# Patient Record
Sex: Female | Born: 1970 | Race: Black or African American | Hispanic: No | Marital: Married | Smoking: Never smoker
Health system: Southern US, Community
[De-identification: ages and names within clinical notes are randomized; demographics above are authoritative.]

## PROBLEM LIST (undated history)

## (undated) HISTORY — PX: OTHER SURGICAL HISTORY: SHX169

## (undated) HISTORY — PX: HYSTEROSCOPY: SHX211

## (undated) HISTORY — PX: TONSILLECTOMY: SUR1361

---

## 2002-12-21 ENCOUNTER — Other Ambulatory Visit: Payer: Self-pay

## 2004-08-07 ENCOUNTER — Emergency Department: Payer: Self-pay | Admitting: Unknown Physician Specialty

## 2004-08-08 ENCOUNTER — Emergency Department: Payer: Self-pay | Admitting: Emergency Medicine

## 2007-05-06 ENCOUNTER — Ambulatory Visit: Payer: Self-pay | Admitting: Unknown Physician Specialty

## 2008-05-12 ENCOUNTER — Ambulatory Visit: Payer: Self-pay | Admitting: Unknown Physician Specialty

## 2009-06-07 ENCOUNTER — Ambulatory Visit: Payer: Self-pay | Admitting: Unknown Physician Specialty

## 2010-05-26 ENCOUNTER — Ambulatory Visit: Payer: Self-pay

## 2010-05-31 ENCOUNTER — Ambulatory Visit: Payer: Self-pay

## 2011-04-25 ENCOUNTER — Emergency Department: Payer: Self-pay | Admitting: Emergency Medicine

## 2011-04-25 LAB — URINALYSIS, COMPLETE
Blood: NEGATIVE
Ketone: NEGATIVE
Nitrite: NEGATIVE
Ph: 5 (ref 4.5–8.0)
Protein: NEGATIVE
RBC,UR: 1 /HPF (ref 0–5)
Specific Gravity: 1.02 (ref 1.003–1.030)
Squamous Epithelial: 1
WBC UR: 1 /HPF (ref 0–5)

## 2011-04-25 LAB — WET PREP, GENITAL

## 2011-04-25 LAB — CBC
HGB: 12.8 g/dL (ref 12.0–16.0)
MCH: 30 pg (ref 26.0–34.0)
MCHC: 32.2 g/dL (ref 32.0–36.0)
RBC: 4.27 10*6/uL (ref 3.80–5.20)
RDW: 13.4 % (ref 11.5–14.5)

## 2011-04-25 LAB — COMPREHENSIVE METABOLIC PANEL
Alkaline Phosphatase: 122 U/L (ref 50–136)
BUN: 11 mg/dL (ref 7–18)
Bilirubin,Total: 0.3 mg/dL (ref 0.2–1.0)
Chloride: 108 mmol/L — ABNORMAL HIGH (ref 98–107)
Creatinine: 0.95 mg/dL (ref 0.60–1.30)
EGFR (African American): >60
Glucose: 100 mg/dL — ABNORMAL HIGH (ref 65–99)
Osmolality: 286 (ref 275–301)
SGOT(AST): 31 U/L (ref 15–37)
Total Protein: 7.2 g/dL (ref 6.4–8.2)

## 2011-04-25 LAB — PREGNANCY, URINE: Pregnancy Test, Urine: NEGATIVE m[IU]/mL

## 2013-07-28 ENCOUNTER — Emergency Department: Payer: Self-pay | Admitting: Emergency Medicine

## 2013-10-26 ENCOUNTER — Emergency Department: Payer: Self-pay | Admitting: Emergency Medicine

## 2013-10-27 LAB — URINALYSIS, COMPLETE
Bilirubin,UR: NEGATIVE
Blood: NEGATIVE
GLUCOSE, UR: NEGATIVE mg/dL (ref 0–75)
KETONE: NEGATIVE
Leukocyte Esterase: NEGATIVE
Nitrite: NEGATIVE
PH: 6 (ref 4.5–8.0)
Protein: NEGATIVE
RBC,UR: <1 /HPF (ref 0–5)
SQUAMOUS EPITHELIAL: NONE SEEN
Specific Gravity: 1.002 (ref 1.003–1.030)
WBC UR: <1 /HPF (ref 0–5)

## 2013-10-27 LAB — CBC
HCT: 38.4 % (ref 35.0–47.0)
HGB: 12.2 g/dL (ref 12.0–16.0)
MCH: 30.2 pg (ref 26.0–34.0)
MCHC: 31.9 g/dL — ABNORMAL LOW (ref 32.0–36.0)
MCV: 95 fL (ref 80–100)
PLATELETS: 330 10*3/uL (ref 150–440)
RBC: 4.06 10*6/uL (ref 3.80–5.20)
RDW: 13.4 % (ref 11.5–14.5)
WBC: 10.3 10*3/uL (ref 3.6–11.0)

## 2013-10-27 LAB — GC/CHLAMYDIA PROBE AMP

## 2013-10-27 LAB — WET PREP, GENITAL

## 2013-10-27 LAB — HCG, QUANTITATIVE, PREGNANCY: Beta Hcg, Quant.: 23808 m[IU]/mL — ABNORMAL HIGH

## 2013-11-15 ENCOUNTER — Emergency Department: Payer: Self-pay | Admitting: Internal Medicine

## 2013-11-15 LAB — HCG, QUANTITATIVE, PREGNANCY: BETA HCG, QUANT.: 106894 m[IU]/mL — AB

## 2013-11-15 LAB — CBC
HCT: 37 % (ref 35.0–47.0)
HGB: 12.3 g/dL (ref 12.0–16.0)
MCH: 31.3 pg (ref 26.0–34.0)
MCHC: 33.1 g/dL (ref 32.0–36.0)
MCV: 95 fL (ref 80–100)
Platelet: 315 10*3/uL (ref 150–440)
RBC: 3.92 10*6/uL (ref 3.80–5.20)
RDW: 13.6 % (ref 11.5–14.5)
WBC: 8 10*3/uL (ref 3.6–11.0)

## 2014-01-05 ENCOUNTER — Encounter: Payer: Self-pay | Admitting: Obstetrics and Gynecology

## 2014-01-29 ENCOUNTER — Encounter: Payer: Self-pay | Admitting: Obstetrics & Gynecology

## 2014-02-09 ENCOUNTER — Observation Stay: Payer: Self-pay | Admitting: Emergency Medicine

## 2014-02-09 LAB — URINALYSIS, COMPLETE
Bacteria: NONE SEEN
Bilirubin,UR: NEGATIVE
Blood: NEGATIVE
Glucose,UR: NEGATIVE mg/dL (ref 0–75)
Ketone: NEGATIVE
LEUKOCYTE ESTERASE: NEGATIVE
NITRITE: NEGATIVE
PROTEIN: NEGATIVE
Ph: 5 (ref 4.5–8.0)
Specific Gravity: 1.032 (ref 1.003–1.030)
Squamous Epithelial: <1

## 2014-03-31 ENCOUNTER — Encounter: Payer: Self-pay | Admitting: Pediatric Cardiology

## 2014-04-09 ENCOUNTER — Encounter
Admit: 2014-04-09 | Disposition: A | Discharge status: Home / Self Care | Payer: Self-pay | Attending: Obstetrics and Gynecology | Admitting: Obstetrics and Gynecology

## 2014-04-23 ENCOUNTER — Encounter
Admit: 2014-04-23 | Disposition: A | Discharge status: Home / Self Care | Payer: Self-pay | Attending: Obstetrics & Gynecology | Admitting: Obstetrics & Gynecology

## 2015-03-15 ENCOUNTER — Other Ambulatory Visit: Payer: Self-pay | Admitting: *Deleted

## 2015-03-15 ENCOUNTER — Inpatient Hospital Stay
Admission: RE | Admit: 2015-03-15 | Discharge: 2015-03-15 | Disposition: A | Discharge status: Home / Self Care | Payer: Self-pay | Source: Ambulatory Visit | Attending: *Deleted | Admitting: *Deleted

## 2015-03-15 ENCOUNTER — Other Ambulatory Visit: Payer: Self-pay | Admitting: Obstetrics & Gynecology

## 2015-03-15 DIAGNOSIS — R928 Other abnormal and inconclusive findings on diagnostic imaging of breast: Secondary | ICD-10-CM

## 2015-03-15 DIAGNOSIS — Z9289 Personal history of other medical treatment: Secondary | ICD-10-CM

## 2015-03-26 ENCOUNTER — Other Ambulatory Visit: Payer: Self-pay

## 2015-03-26 ENCOUNTER — Ambulatory Visit: Payer: Self-pay

## 2015-04-08 ENCOUNTER — Encounter
Admission: RE | Admit: 2015-04-08 | Discharge: 2015-04-08 | Disposition: A | Discharge status: Home / Self Care | Payer: Managed Care, Other (non HMO) | Source: Ambulatory Visit | Attending: Obstetrics & Gynecology | Admitting: Obstetrics & Gynecology

## 2015-04-08 DIAGNOSIS — Z01812 Encounter for preprocedural laboratory examination: Secondary | ICD-10-CM | POA: Diagnosis present

## 2015-04-08 DIAGNOSIS — N920 Excessive and frequent menstruation with regular cycle: Secondary | ICD-10-CM | POA: Diagnosis not present

## 2015-04-08 LAB — CBC
HCT: 37.3 % (ref 35.0–47.0)
Hemoglobin: 12.3 g/dL (ref 12.0–16.0)
MCH: 28.7 pg (ref 26.0–34.0)
MCHC: 33.1 g/dL (ref 32.0–36.0)
MCV: 86.8 fL (ref 80.0–100.0)
PLATELETS: 381 10*3/uL (ref 150–440)
RBC: 4.29 MIL/uL (ref 3.80–5.20)
RDW: 14.8 % — AB (ref 11.5–14.5)
WBC: 5.8 10*3/uL (ref 3.6–11.0)

## 2015-04-08 LAB — BASIC METABOLIC PANEL
ANION GAP: 4 — AB (ref 5–15)
BUN: 15 mg/dL (ref 6–20)
CALCIUM: 9.3 mg/dL (ref 8.9–10.3)
CHLORIDE: 108 mmol/L (ref 101–111)
CO2: 25 mmol/L (ref 22–32)
CREATININE: 0.87 mg/dL (ref 0.44–1.00)
GFR calc Af Amer: >60 mL/min (ref >60)
GFR calc non Af Amer: >60 mL/min (ref >60)
GLUCOSE: 101 mg/dL — AB (ref 65–99)
Potassium: 4.1 mmol/L (ref 3.5–5.1)
Sodium: 137 mmol/L (ref 135–145)

## 2015-04-08 LAB — TYPE AND SCREEN
ABO/RH(D): O POS
Antibody Screen: NEGATIVE

## 2015-04-08 LAB — ABO/RH: ABO/RH(D): O POS

## 2015-04-08 NOTE — Patient Instructions (Signed)
  Your procedure is scheduled on:04/13/15 Report to Day Surgery. To find out your arrival time please call [phone] between 1PM - 3PM on 04/12/15.  Remember: Instructions that are not followed completely may result in serious medical risk, up to and including death, or upon the discretion of your surgeon and anesthesiologist your surgery may need to be rescheduled.    _x___ 1. Do not eat food or drink liquids after midnight. No gum chewing or hard candies.     __x__ 2. No Alcohol for 24 hours before or after surgery.   ____ 3. Bring all medications with you on the day of surgery if instructed.    _x___ 4. Notify your doctor if there is any change in your medical condition     (cold, fever, infections).     Do not wear jewelry, make-up, hairpins, clips or nail polish.  Do not wear lotions, powders, or perfumes. You may wear deodorant.  Do not shave 48 hours prior to surgery. Men may shave face and neck.  Do not bring valuables to the hospital.    [facility] is not responsible for any belongings or valuables.               Contacts, dentures or bridgework may not be worn into surgery.  Leave your suitcase in the car. After surgery it may be brought to your room.  For patients admitted to the hospital, discharge time is determined by your                treatment team.   Patients discharged the day of surgery will not be allowed to drive home.   Please read over the following fact sheets that you were given:   Surgical Site Infection Prevention   ____ Take these medicines the morning of surgery with A SIP OF WATER:    1. May take claritin if needed  2.   3.   4.  5.  6.  ____ Fleet Enema (as directed)   __x__ Use CHG Soap as directed  ____ Use inhalers on the day of surgery  ____ Stop metformin 2 days prior to surgery    ____ Take 1/2 of usual insulin dose the night before surgery and none on the morning of surgery.   ____ Stop Coumadin/Plavix/aspirin on   __x__  Stop Anti-inflammatories on  May take tylenol only  ____ Stop supplements until after surgery.    ____ Bring C-Pap to the hospital.

## 2015-04-12 ENCOUNTER — Ambulatory Visit
Admission: RE | Admit: 2015-04-12 | Discharge: 2015-04-12 | Disposition: A | Discharge status: Home / Self Care | Payer: Managed Care, Other (non HMO) | Source: Ambulatory Visit | Attending: Obstetrics & Gynecology | Admitting: Obstetrics & Gynecology

## 2015-04-12 DIAGNOSIS — R928 Other abnormal and inconclusive findings on diagnostic imaging of breast: Secondary | ICD-10-CM | POA: Insufficient documentation

## 2015-04-13 ENCOUNTER — Ambulatory Visit: Payer: Managed Care, Other (non HMO) | Admitting: Anesthesiology

## 2015-04-13 ENCOUNTER — Encounter: Payer: Self-pay | Admitting: *Deleted

## 2015-04-13 ENCOUNTER — Ambulatory Visit
Admission: RE | Admit: 2015-04-13 | Discharge: 2015-04-13 | Disposition: A | Discharge status: Home / Self Care | Payer: Managed Care, Other (non HMO) | Source: Ambulatory Visit | Attending: Obstetrics & Gynecology | Admitting: Obstetrics & Gynecology

## 2015-04-13 ENCOUNTER — Encounter
Admission: RE | Disposition: A | Discharge status: Home / Self Care | Payer: Self-pay | Source: Ambulatory Visit | Attending: Obstetrics & Gynecology

## 2015-04-13 DIAGNOSIS — K429 Umbilical hernia without obstruction or gangrene: Secondary | ICD-10-CM | POA: Diagnosis not present

## 2015-04-13 DIAGNOSIS — N920 Excessive and frequent menstruation with regular cycle: Secondary | ICD-10-CM | POA: Insufficient documentation

## 2015-04-13 DIAGNOSIS — N838 Other noninflammatory disorders of ovary, fallopian tube and broad ligament: Secondary | ICD-10-CM | POA: Insufficient documentation

## 2015-04-13 DIAGNOSIS — Z8249 Family history of ischemic heart disease and other diseases of the circulatory system: Secondary | ICD-10-CM | POA: Insufficient documentation

## 2015-04-13 DIAGNOSIS — Z801 Family history of malignant neoplasm of trachea, bronchus and lung: Secondary | ICD-10-CM | POA: Diagnosis not present

## 2015-04-13 DIAGNOSIS — N946 Dysmenorrhea, unspecified: Secondary | ICD-10-CM | POA: Diagnosis not present

## 2015-04-13 DIAGNOSIS — Z8349 Family history of other endocrine, nutritional and metabolic diseases: Secondary | ICD-10-CM | POA: Diagnosis not present

## 2015-04-13 DIAGNOSIS — D251 Intramural leiomyoma of uterus: Secondary | ICD-10-CM | POA: Diagnosis not present

## 2015-04-13 DIAGNOSIS — Z302 Encounter for sterilization: Secondary | ICD-10-CM | POA: Diagnosis not present

## 2015-04-13 DIAGNOSIS — N800 Endometriosis of uterus: Secondary | ICD-10-CM | POA: Diagnosis not present

## 2015-04-13 HISTORY — PX: LAPAROSCOPIC BILATERAL SALPINGECTOMY: SHX5889

## 2015-04-13 HISTORY — PX: LAPAROSCOPIC HYSTERECTOMY: SHX1926

## 2015-04-13 HISTORY — PX: UMBILICAL HERNIA REPAIR: SHX196

## 2015-04-13 LAB — POCT PREGNANCY, URINE: Preg Test, Ur: NEGATIVE

## 2015-04-13 SURGERY — HYSTERECTOMY, TOTAL, LAPAROSCOPIC
Anesthesia: General | Site: Uterus | Wound class: Clean Contaminated

## 2015-04-13 MED ORDER — DEXAMETHASONE SODIUM PHOSPHATE 10 MG/ML IJ SOLN
INTRAMUSCULAR | Status: DC | PRN
  Administered 2015-04-13: 5 mg via INTRAVENOUS

## 2015-04-13 MED ORDER — KETOROLAC TROMETHAMINE 30 MG/ML IJ SOLN
30.0000 mg | Freq: Four times a day (QID) | INTRAMUSCULAR | Status: DC
  Administered 2015-04-13: 30 mg via INTRAVENOUS

## 2015-04-13 MED ORDER — OXYCODONE-ACETAMINOPHEN 5-325 MG PO TABS: 1.0000 | ORAL_TABLET | ORAL | Status: DC | PRN

## 2015-04-13 MED ORDER — MIDAZOLAM HCL 5 MG/5ML IJ SOLN
INTRAMUSCULAR | Status: DC | PRN
  Administered 2015-04-13: 2 mg via INTRAVENOUS

## 2015-04-13 MED ORDER — ROCURONIUM BROMIDE 100 MG/10ML IV SOLN
INTRAVENOUS | Status: DC | PRN
  Administered 2015-04-13: 30 mg via INTRAVENOUS
  Administered 2015-04-13 (×2): 10 mg via INTRAVENOUS

## 2015-04-13 MED ORDER — SUCCINYLCHOLINE CHLORIDE 20 MG/ML IJ SOLN
INTRAMUSCULAR | Status: DC | PRN
  Administered 2015-04-13: 100 mg via INTRAVENOUS

## 2015-04-13 MED ORDER — HEPARIN SODIUM (PORCINE) 5000 UNIT/ML IJ SOLN
5000.0000 [IU] | Freq: Once | INTRAMUSCULAR | Status: AC
  Administered 2015-04-13: 5000 [IU] via SUBCUTANEOUS

## 2015-04-13 MED ORDER — LACTATED RINGERS IV SOLN
INTRAVENOUS | Status: DC
  Administered 2015-04-13 (×2): via INTRAVENOUS

## 2015-04-13 MED ORDER — FENTANYL CITRATE (PF) 100 MCG/2ML IJ SOLN
25.0000 ug | INTRAMUSCULAR | Status: DC | PRN
  Administered 2015-04-13 (×4): 25 ug via INTRAVENOUS

## 2015-04-13 MED ORDER — ACETAMINOPHEN 325 MG PO TABS: 650.0000 mg | ORAL_TABLET | ORAL | Status: DC | PRN

## 2015-04-13 MED ORDER — PROPOFOL 10 MG/ML IV BOLUS
INTRAVENOUS | Status: DC | PRN
  Administered 2015-04-13: 170 mg via INTRAVENOUS

## 2015-04-13 MED ORDER — ONDANSETRON HCL 4 MG/2ML IJ SOLN: 4.0000 mg | Freq: Once | INTRAMUSCULAR | Status: DC | PRN

## 2015-04-13 MED ORDER — CEFAZOLIN SODIUM-DEXTROSE 2-4 GM/100ML-% IV SOLN
2.0000 g | Freq: Once | INTRAVENOUS | Status: AC
  Administered 2015-04-13: 2 g via INTRAVENOUS

## 2015-04-13 MED ORDER — FENTANYL CITRATE (PF) 100 MCG/2ML IJ SOLN
INTRAMUSCULAR | Status: DC | PRN
  Administered 2015-04-13 (×3): 50 ug via INTRAVENOUS
  Administered 2015-04-13: 100 ug via INTRAVENOUS
  Administered 2015-04-13: 50 ug via INTRAVENOUS

## 2015-04-13 MED ORDER — OXYCODONE-ACETAMINOPHEN 5-325 MG PO TABS
ORAL_TABLET | ORAL | Status: AC
  Administered 2015-04-13: 1
  Filled 2015-04-13: qty 1

## 2015-04-13 MED ORDER — CEFAZOLIN SODIUM-DEXTROSE 2-3 GM-% IV SOLR
INTRAVENOUS | Status: DC | PRN
  Administered 2015-04-13: 2 g via INTRAVENOUS

## 2015-04-13 MED ORDER — ACETAMINOPHEN 650 MG RE SUPP: 650.0000 mg | RECTAL | Status: DC | PRN

## 2015-04-13 MED ORDER — IBUPROFEN 600 MG PO TABS: 600.0000 mg | ORAL_TABLET | Freq: Four times a day (QID) | ORAL | Status: AC | PRN

## 2015-04-13 MED ORDER — SUGAMMADEX SODIUM 200 MG/2ML IV SOLN
INTRAVENOUS | Status: DC | PRN
  Administered 2015-04-13: 217.8 mg via INTRAVENOUS

## 2015-04-13 MED ORDER — IBUPROFEN 600 MG PO TABS: 600.0000 mg | ORAL_TABLET | Freq: Four times a day (QID) | ORAL | Status: DC | PRN

## 2015-04-13 MED ORDER — FENTANYL CITRATE (PF) 100 MCG/2ML IJ SOLN
INTRAMUSCULAR | Status: AC
  Administered 2015-04-13: 25 ug via INTRAVENOUS
  Filled 2015-04-13: qty 2

## 2015-04-13 MED ORDER — MORPHINE SULFATE (PF) 2 MG/ML IV SOLN: 1.0000 mg | INTRAVENOUS | Status: DC | PRN

## 2015-04-13 MED ORDER — FAMOTIDINE 20 MG PO TABS
20.0000 mg | ORAL_TABLET | Freq: Once | ORAL | Status: AC
  Administered 2015-04-13: 20 mg via ORAL

## 2015-04-13 MED ORDER — ONDANSETRON HCL 4 MG/2ML IJ SOLN
INTRAMUSCULAR | Status: DC | PRN
  Administered 2015-04-13 (×2): 4 mg via INTRAVENOUS

## 2015-04-13 MED ORDER — KETOROLAC TROMETHAMINE 30 MG/ML IJ SOLN
INTRAMUSCULAR | Status: AC
  Administered 2015-04-13: 30 mg via INTRAVENOUS
  Filled 2015-04-13: qty 1

## 2015-04-13 MED ORDER — LIDOCAINE HCL (CARDIAC) 20 MG/ML IV SOLN
INTRAVENOUS | Status: DC | PRN
  Administered 2015-04-13: 60 mg via INTRAVENOUS

## 2015-04-13 SURGICAL SUPPLY — 61 items
BAG URO DRAIN 2000ML W/SPOUT (MISCELLANEOUS) ×4 IMPLANT
BLADE SURG SZ11 CARB STEEL (BLADE) ×4 IMPLANT
CANISTER SUCT 1200ML W/VALVE (MISCELLANEOUS) ×4 IMPLANT
CATH FOLEY 2WAY  5CC 16FR (CATHETERS) ×1
CATH ROBINSON RED A/P 16FR (CATHETERS) ×4 IMPLANT
CATH URTH 16FR FL 2W BLN LF (CATHETERS) ×3 IMPLANT
CHLORAPREP W/TINT 26ML (MISCELLANEOUS) ×4 IMPLANT
DEFOGGER SCOPE WARMER CLEARIFY (MISCELLANEOUS) ×4 IMPLANT
DEVICE SUTURE ENDOST 10MM (ENDOMECHANICALS) ×4 IMPLANT
DRAPE LEGGINS SURG 28X43 STRL (DRAPES) ×4 IMPLANT
DRAPE SHEET LG 3/4 BI-LAMINATE (DRAPES) ×4 IMPLANT
DRAPE UNDER BUTTOCK W/FLU (DRAPES) ×4 IMPLANT
DRESSING TELFA 4X3 1S ST N-ADH (GAUZE/BANDAGES/DRESSINGS) ×12 IMPLANT
DRSG TEGADERM 2-3/8X2-3/4 SM (GAUZE/BANDAGES/DRESSINGS) ×12 IMPLANT
ENDOPOUCH RETRIEVER 10 (MISCELLANEOUS) IMPLANT
GAUZE SPONGE NON-WVN 2X2 STRL (MISCELLANEOUS) ×6 IMPLANT
GLOVE PI ORTHOPRO 6.5 (GLOVE) ×4
GLOVE PI ORTHOPRO STRL 6.5 (GLOVE) ×12 IMPLANT
GLOVE SURG SYN 6.5 ES PF (GLOVE) ×16 IMPLANT
GOWN STRL REUS W/ TWL LRG LVL3 (GOWN DISPOSABLE) ×9 IMPLANT
GOWN STRL REUS W/ TWL XL LVL3 (GOWN DISPOSABLE) ×3 IMPLANT
GOWN STRL REUS W/TWL LRG LVL3 (GOWN DISPOSABLE) ×3
GOWN STRL REUS W/TWL XL LVL3 (GOWN DISPOSABLE) ×1
GRASPER SUT TROCAR 14GX15 (MISCELLANEOUS) ×4 IMPLANT
HIBICLENS CHG 4% 32OZ (MISCELLANEOUS) ×4 IMPLANT
IRRIGATION STRYKERFLOW (MISCELLANEOUS) ×3 IMPLANT
IRRIGATOR STRYKERFLOW (MISCELLANEOUS) ×4
IV LACTATED RINGERS 1000ML (IV SOLUTION) ×4 IMPLANT
KIT PINK PAD W/HEAD ARE REST (MISCELLANEOUS) ×4
KIT PINK PAD W/HEAD ARM REST (MISCELLANEOUS) ×3 IMPLANT
KIT RM TURNOVER CYSTO AR (KITS) ×4 IMPLANT
LABEL OR SOLS (LABEL) ×4 IMPLANT
LIGASURE BLUNT 5MM 37CM (INSTRUMENTS) ×4 IMPLANT
LIQUID BAND (GAUZE/BANDAGES/DRESSINGS) ×4 IMPLANT
MANIPULATOR UTERINE 4.5 ZUMI (MISCELLANEOUS) IMPLANT
MANIPULATOR VCARE LG CRV RETR (MISCELLANEOUS) IMPLANT
MANIPULATOR VCARE SML CRV RETR (MISCELLANEOUS) ×4 IMPLANT
MANIPULATOR VCARE STD CRV RETR (MISCELLANEOUS) IMPLANT
NEEDLE VERESS 14GA 120MM (NEEDLE) ×4 IMPLANT
NS IRRIG 500ML POUR BTL (IV SOLUTION) ×4 IMPLANT
PACK LAP CHOLECYSTECTOMY (MISCELLANEOUS) ×4 IMPLANT
PAD OB MATERNITY 4.3X12.25 (PERSONAL CARE ITEMS) ×4 IMPLANT
PAD PREP 24X41 OB/GYN DISP (PERSONAL CARE ITEMS) ×4 IMPLANT
PENCIL ELECTRO HAND CTR (MISCELLANEOUS) ×4 IMPLANT
SCISSORS METZENBAUM CVD 33 (INSTRUMENTS) IMPLANT
SET CYSTO W/LG BORE CLAMP LF (SET/KITS/TRAYS/PACK) ×4 IMPLANT
SLEEVE ENDOPATH XCEL 5M (ENDOMECHANICALS) ×8 IMPLANT
SPONGE VERSALON 2X2 STRL (MISCELLANEOUS) ×2
SURGILUBE 2OZ TUBE FLIPTOP (MISCELLANEOUS) ×4 IMPLANT
SUT ENDO VLOC 180-0-8IN (SUTURE) ×4 IMPLANT
SUT MNCRL AB 4-0 PS2 18 (SUTURE) ×4 IMPLANT
SUT VIC AB 0 CT1 36 (SUTURE) ×4 IMPLANT
SUT VIC AB 2-0 UR6 27 (SUTURE) ×4 IMPLANT
SUT VIC AB 4-0 PS2 18 (SUTURE) ×4 IMPLANT
SYR 50ML LL SCALE MARK (SYRINGE) ×4 IMPLANT
SYRINGE 10CC LL (SYRINGE) ×8 IMPLANT
TROCAR BLUNT TIP 12MM OMST12BT (TROCAR) ×4 IMPLANT
TROCAR ENDO BLADELESS 11MM (ENDOMECHANICALS) ×4 IMPLANT
TROCAR XCEL NON-BLD 5MMX100MML (ENDOMECHANICALS) ×4 IMPLANT
TUBING INSUFFLATOR HEATED (MISCELLANEOUS) ×4 IMPLANT
TUBING INSUFFLATOR HI FLOW (MISCELLANEOUS) ×4 IMPLANT

## 2015-04-13 NOTE — Op Note (Signed)
Total Laparoscopic Hysterectomy Operative Note Procedure Date: 04/13/2015  Patient:  Melanie Krause  45 y.o. female  PRE-OPERATIVE DIAGNOSIS:  DESIRE FOR PERMANENT STERILITY,ENDOMETRIOSIS,MENORRHAGIA  POST-OPERATIVE DIAGNOSIS:  DESIRE FOR PERMANENT STERILITY,ENDOMETRIOSIS,MENORRHAGIA, UMBILICAL HERNIA  PROCEDURE:  Procedure(s): HYSTERECTOMY TOTAL LAPAROSCOPIC (N/A) LAPAROSCOPIC BILATERAL SALPINGECTOMY (Bilateral) HERNIA REPAIR UMBILICAL ADULT  SURGEON:  Surgeon(s) and Role:    * Chelsea C Ward, MD - Primary  ANESTHESIA:  General via ET  I/O   1500 crystalloid, 50 EBL, 200 UOP  FINDINGS:  Small uterus, normal ovaries and fallopian tubes bilaterally.  Normal upper abdomen.  SPECIMEN: Uterus, Cervix, and bilateral fallopian tubes  COMPLICATIONS: none apparent  DISPOSITION: vital signs stable to PACU  Indication for Surgery: 45 y.o. G1P1 who desired permanent sterilization and solution to her menorrhagia/dysmenorrhea.  She was counseled extensively about options, and decided upon total hysterectomy, in addition to bilateral salpingectomy.  Risks of surgery were discussed with the patient including but not limited to: bleeding which may require transfusion or reoperation; infection which may require antibiotics; injury to bowel, bladder, ureters or other surrounding organs; need for additional procedures including laparotomy, blood clot, incisional problems and other postoperative/anesthesia complications. Written informed consent was obtained.      PROCEDURE IN DETAIL:  The patient had 5000u Heparin Sub-q and sequential compression devices applied to her lower extremities while in the preoperative area.  She was then taken to the operating room where general anesthesia was administered and was found to be adequate.  She was placed in the dorsal lithotomy position, and was prepped and draped in a sterile manner. A surgical time-out was performed.  A Foley catheter was inserted into  her bladder and attached to constant drainage and a V-Care uterine manipulator was then advanced into the uterus and a good fit around the cervix was noted. The gloves were changed, and attention was turned to the abdomen where an umbilical incision was made with the scalpel. This opened the channel of an umbilical hernia, about 1cm x 1cm, and the 82mm balloon trochar was inserted atraumatically.  Opening pressure was 38mm Hg, and the abdomen was insufflated to 15mg Hg carbon dioxide gas and adequate pneumoperitoneum was obtained. A survey of the patient's pelvis and abdomen revealed the findings as mentioned above. Two 33mm ports were inserted in the lower left and right quadrants under visualization.    The bilateral fallopian tubes were separated from the mesosalpinx using the Ligasure. The bilateral round ligaments were transected and anterior broad ligament divided and brought across the uterus to separate the vesicouterine peritoneum and create a bladder flap. The bladder was pushed away from the uterus. The bilateral uterine arteries were ligated and transected. The bilateral uterosacral and cardinal ligaments were ligated and transected. A colpotomy was made around the V-Care cervical cup and the uterus, cervix, and bilateral tubes were removed through the vagina. The vaginal cuff was closed with the Endostitch and V-Lock running stitch. This was tested for integrity using the surgeon's finger. After a change of gloves, the pneumoperitoneum deflated and recreated and surgical site inspected, and found to be hemostatic. Bilateral ureters were visualized vermiuclating. No intraoperative injury to surrounding organs was noted. The inlet closure device was used to create a figure of eight closure around the umbilical trochar site/hernia.  This was tested for integrity and found to be intact.  The abdomen was desufflated and all instruments were then removed.   All skin incisions were closed with 4-0  monocryl and covered with surgical glue. The patient tolerated  the procedures well.  All instruments, needles, and sponge counts were correct x 2. The patient was taken to the recovery room in stable condition.   ---- Larey Days, MD Attending Obstetrician and Gynecologist [facility]

## 2015-04-13 NOTE — Anesthesia Preprocedure Evaluation (Signed)
Anesthesia Evaluation  Patient identified by MRN, date of birth, ID band Patient awake    Reviewed: Allergy & Precautions, NPO status , Patient's Chart, lab work & pertinent test results  Airway Mallampati: II  TM Distance: >3 FB Neck ROM: Full    Dental no notable dental hx.    Pulmonary neg pulmonary ROS,    Pulmonary exam normal        Cardiovascular negative cardio ROS Normal cardiovascular exam     Neuro/Psych negative neurological ROS  negative psych ROS   GI/Hepatic negative GI ROS, Neg liver ROS,   Endo/Other  negative endocrine ROS  Renal/GU negative Renal ROS  Female GU complaint  negative genitourinary   Musculoskeletal negative musculoskeletal ROS (+)   Abdominal Normal abdominal exam  (+)   Peds negative pediatric ROS (+)  Hematology negative hematology ROS (+)   Anesthesia Other Findings   Reproductive/Obstetrics negative OB ROS                             Anesthesia Physical Anesthesia Plan  ASA: II  Anesthesia Plan: General   Post-op Pain Management:    Induction: Intravenous  Airway Management Planned: Oral ETT  Additional Equipment:   Intra-op Plan:   Post-operative Plan: Extubation in OR  Informed Consent: I have reviewed the patients History and Physical, chart, labs and discussed the procedure including the risks, benefits and alternatives for the proposed anesthesia with the patient or authorized representative who has indicated his/her understanding and acceptance.   Dental advisory given  Plan Discussed with: CRNA and Surgeon  Anesthesia Plan Comments:         Anesthesia Quick Evaluation

## 2015-04-13 NOTE — Transfer of Care (Signed)
Immediate Anesthesia Transfer of Care Note  Patient: Melanie Krause  Procedure(s) Performed: Procedure(s): HYSTERECTOMY TOTAL LAPAROSCOPIC (N/A) LAPAROSCOPIC BILATERAL SALPINGECTOMY (Bilateral)  Patient Location: PACU  Anesthesia Type:General  Level of Consciousness: sedated  Airway & Oxygen Therapy: Patient Spontanous Breathing and Patient connected to face mask oxygen  Post-op Assessment: Report given to RN and Post -op Vital signs reviewed and stable  Post vital signs: Reviewed and stable  Last Vitals:  Filed Vitals:   04/13/15 1315  BP: 123/86  Pulse: 98  Temp: 37.4 C  Resp: 16    Complications: No apparent anesthesia complications

## 2015-04-13 NOTE — H&P (Signed)
H&P Update  PLEASE SEE PAPER H&P  Pt was last seen in my office, and complete history and physical performed.  The surgical history has been reviewed and remains accurate without interval change. The patient was re-examined and patient's physiologic condition has not changed significantly in the last 30 days.  No new pharmacological allergies or types of therapy has been initiated.  No Known Allergies  History reviewed. No pertinent past medical history. Past Surgical History  Procedure Laterality Date  . Cesarean section    . Tonsillectomy    . Laparoscopy    . Hysteroscopy      BP 123/86 mmHg  Pulse 98  Temp(Src) 99.3 F (37.4 C) (Oral)  Resp 16  SpO2 100%  NAD RRR no murmurs CTAB, no wheezing, resps unlabored +BS, soft, NTTP No c/c/e Pelvic exam deferred  The above history was confirmed with the patient. The condition still exists that makes this procedure necessary. Surgical plan includes Total Laparoscopic Hysterectomy, Bilateral Salpingectomy, as confirmed on the consent. The treatment plan remains the same, without new options for care.  The patient understands the potential benefits and risks and the consents have been signed and placed on the chart.     Larey Days, MD Attending Obstetrician Gynecologist [facility]

## 2015-04-13 NOTE — Anesthesia Postprocedure Evaluation (Signed)
Anesthesia Post Note  Patient: [name]  Procedure(s) Performed: Procedure(s) (LRB): HYSTERECTOMY TOTAL LAPAROSCOPIC (N/A) LAPAROSCOPIC BILATERAL SALPINGECTOMY (Bilateral) HERNIA REPAIR UMBILICAL ADULT  Patient location during evaluation: PACU Anesthesia Type: General Level of consciousness: oriented and awake and alert Pain management: pain level controlled Vital Signs Assessment: post-procedure vital signs reviewed and stable Respiratory status: spontaneous breathing Cardiovascular status: blood pressure returned to baseline Anesthetic complications: no    Last Vitals:  Filed Vitals:   04/13/15 2012 04/13/15 2043  BP: 160/87 152/82  Pulse: 80 82  Temp: 37.7 C 37.2 C  Resp: 20 18    Last Pain:  Filed Vitals:   04/13/15 2045  PainSc: 3                  [name]

## 2015-04-13 NOTE — Anesthesia Procedure Notes (Signed)
Procedure Name: Intubation Date/Time: 04/13/2015 4:57 PM Performed by: Dionne Bucy Pre-anesthesia Checklist: Patient identified, Patient being monitored, Timeout performed, Emergency Drugs available and Suction available Patient Re-evaluated:Patient Re-evaluated prior to inductionOxygen Delivery Method: Circle system utilized Preoxygenation: Pre-oxygenation with 100% oxygen Intubation Type: IV induction Ventilation: Mask ventilation without difficulty Laryngoscope Size: Mac and 3 Grade View: Grade II Tube type: Oral Tube size: 7.0 mm Number of attempts: 1 Airway Equipment and Method: Stylet Placement Confirmation: ETT inserted through vocal cords under direct vision,  positive ETCO2 and breath sounds checked- equal and bilateral Secured at: 21 cm Tube secured with: Tape Dental Injury: Teeth and Oropharynx as per pre-operative assessment

## 2015-04-13 NOTE — Discharge Instructions (Signed)
Discharge instructions:  Call office if you have any of the following: fever >101 F, chills, excessive vaginal bleeding, incision drainage or problems, leg pain or redness, or any other concerns.   Activity: Do not lift > 10 lbs for 8 weeks.  No intercourse or tampons for 8 weeks.  No driving for 1-2 weeks.   Call your doctor for increased pain or vaginal bleeding, temperature above 101.0, or concerns. No strenuous activity or heavy lifting for 8 weeks. No intercourse, tampons, douching, or enemas for 8 weeks. No tub baths-showers only. No driving for 2 weeks or while taking pain medications.       AMBULATORY SURGERY  DISCHARGE INSTRUCTIONS   1) The drugs that you were given will stay in your system until tomorrow so for the next 24 hours you should not:  A) Drive an automobile B) Make any legal decisions C) Drink any alcoholic beverage   2) You may resume regular meals tomorrow.  Today it is better to start with liquids and gradually work up to solid foods.  You may eat anything you prefer, but it is better to start with liquids, then soup and crackers, and gradually work up to solid foods.   3) Please notify your doctor immediately if you have any unusual bleeding, trouble breathing, redness and pain at the surgery site, drainage, fever, or pain not relieved by medication.    4) Additional Instructions:        Please contact your physician with any problems or Same Day Surgery at [phone], Monday through Friday 6 am to 4 pm, or [place] at [facility] number at [phone].

## 2015-04-14 ENCOUNTER — Encounter: Payer: Self-pay | Admitting: Obstetrics & Gynecology

## 2015-04-15 ENCOUNTER — Encounter: Payer: Self-pay | Admitting: Obstetrics & Gynecology

## 2015-04-15 LAB — SURGICAL PATHOLOGY

## 2016-12-04 ENCOUNTER — Emergency Department
Admission: EM | Admit: 2016-12-04 | Discharge: 2016-12-04 | Disposition: A | Discharge status: Home / Self Care | Payer: Self-pay | Attending: Emergency Medicine | Admitting: Emergency Medicine

## 2016-12-04 ENCOUNTER — Emergency Department: Payer: Self-pay

## 2016-12-04 ENCOUNTER — Other Ambulatory Visit: Payer: Self-pay

## 2016-12-04 DIAGNOSIS — J189 Pneumonia, unspecified organism: Secondary | ICD-10-CM | POA: Insufficient documentation

## 2016-12-04 LAB — TROPONIN I: Troponin I: <0.03 ng/mL (ref <0.03)

## 2016-12-04 LAB — CBC
HCT: 38.6 % (ref 35.0–47.0)
Hemoglobin: 12.6 g/dL (ref 12.0–16.0)
MCH: 29.8 pg (ref 26.0–34.0)
MCHC: 32.6 g/dL (ref 32.0–36.0)
MCV: 91.4 fL (ref 80.0–100.0)
PLATELETS: 404 10*3/uL (ref 150–440)
RBC: 4.22 MIL/uL (ref 3.80–5.20)
RDW: 13.9 % (ref 11.5–14.5)
WBC: 8.7 10*3/uL (ref 3.6–11.0)

## 2016-12-04 LAB — COMPREHENSIVE METABOLIC PANEL
ALBUMIN: 3.5 g/dL (ref 3.5–5.0)
ALT: 28 U/L (ref 14–54)
ANION GAP: 9 (ref 5–15)
AST: 33 U/L (ref 15–41)
Alkaline Phosphatase: 123 U/L (ref 38–126)
BILIRUBIN TOTAL: 0.3 mg/dL (ref 0.3–1.2)
BUN: 11 mg/dL (ref 6–20)
CHLORIDE: 105 mmol/L (ref 101–111)
CO2: 25 mmol/L (ref 22–32)
Calcium: 8.8 mg/dL — ABNORMAL LOW (ref 8.9–10.3)
Creatinine, Ser: 0.92 mg/dL (ref 0.44–1.00)
GFR calc Af Amer: >60 mL/min (ref >60)
GFR calc non Af Amer: >60 mL/min (ref >60)
GLUCOSE: 159 mg/dL — AB (ref 65–99)
POTASSIUM: 3.7 mmol/L (ref 3.5–5.1)
SODIUM: 139 mmol/L (ref 135–145)
TOTAL PROTEIN: 7.1 g/dL (ref 6.5–8.1)

## 2016-12-04 MED ORDER — AZITHROMYCIN 500 MG PO TABS
500.0000 mg | ORAL_TABLET | Freq: Once | ORAL | Status: AC
  Administered 2016-12-04: 500 mg via ORAL
  Filled 2016-12-04: qty 1

## 2016-12-04 MED ORDER — BENZONATATE 100 MG PO CAPS: 100.0000 mg | ORAL_CAPSULE | Freq: Four times a day (QID) | ORAL | 0 refills | Status: AC | PRN

## 2016-12-04 MED ORDER — AZITHROMYCIN 250 MG PO TABS: ORAL_TABLET | ORAL | 0 refills | Status: DC

## 2016-12-04 MED ORDER — BENZONATATE 100 MG PO CAPS
200.0000 mg | ORAL_CAPSULE | Freq: Once | ORAL | Status: AC
  Administered 2016-12-04: 200 mg via ORAL
  Filled 2016-12-04: qty 2

## 2016-12-04 NOTE — ED Provider Notes (Signed)
[facility] Emergency Department Provider Note  ____________________________________________   First MD Initiated Contact with Patient 12/04/16 2127     (approximate)  I have reviewed the triage vital signs and the nursing notes.   HISTORY  Chief Complaint URI   HPI Melanie Krause is a 46 y.o. female who presents to the emergency Department with roughly a week to week and a half of respiratory symptoms. It began as an upper respiratory tract infection with rhinorrhea and dry cough and sore throat and she initially felt like she was improving however for the past 2 days she has taken a turn for the worse again. She now has a cough that is productive of small amounts of green sputum and she feels febrile. She does have mild severity sharp upper chest pain nonradiating worse with cough improved with not coughing.   No past medical history on file.  There are no active problems to display for this patient.   Past Surgical History:  Procedure Laterality Date  . CESAREAN SECTION    . HYSTEROSCOPY    . LAPAROSCOPIC BILATERAL SALPINGECTOMY Bilateral 04/13/2015   Procedure: LAPAROSCOPIC BILATERAL SALPINGECTOMY;  Surgeon: Honor Loh Ward, MD;  Location: [facility] ORS;  Service: Gynecology;  Laterality: Bilateral;  . LAPAROSCOPIC HYSTERECTOMY N/A 04/13/2015   Procedure: HYSTERECTOMY TOTAL LAPAROSCOPIC;  Surgeon: Honor Loh Ward, MD;  Location: [facility] ORS;  Service: Gynecology;  Laterality: N/A;  . laparoscopy    . TONSILLECTOMY    . UMBILICAL HERNIA REPAIR  04/13/2015   Procedure: HERNIA REPAIR UMBILICAL ADULT;  Surgeon: Honor Loh Ward, MD;  Location: [facility] ORS;  Service: Gynecology;;    Prior to Admission medications   Medication Sig Start Date End Date Taking? Authorizing Provider  azithromycin (ZITHROMAX Z-PAK) 250 MG tablet Take 2 tablets (500 mg) on  Day 1,  followed by 1 tablet (250 mg) once daily on Days 2 through 5. 12/04/16   Darel Hong, MD  benzonatate  (TESSALON PERLES) 100 MG capsule Take 1 capsule (100 mg total) by mouth every 6 (six) hours as needed for cough. 12/04/16 12/04/17  Darel Hong, MD  ibuprofen (ADVIL,MOTRIN) 600 MG tablet Take 1 tablet (600 mg total) by mouth every 6 (six) hours as needed. 04/13/15   Ward, Honor Loh, MD  loratadine (CLARITIN) 10 MG tablet Take 10 mg by mouth daily as needed for allergies.    [provider]  oxyCODONE-acetaminophen (ROXICET) 5-325 MG tablet Take 1 tablet by mouth every 4 (four) hours as needed for severe pain. 04/13/15   Ward, Honor Loh, MD    Allergies Patient has no known allergies.  Family History  Problem Relation Age of Onset  . Breast cancer Neg Hx     Social History Social History   Tobacco Use  . Smoking status: Never Smoker  . Smokeless tobacco: Never Used  Substance Use Topics  . Alcohol use: Yes    Comment: occassional  . Drug use: No    Review of Systems Constitutional: Positive for fevers ENT: Positive for sore throat. Cardiovascular: Positive for chest pain. Respiratory: Positive for shortness of breath. Gastrointestinal: No abdominal pain.  No nausea, no vomiting.  No diarrhea.  No constipation. Musculoskeletal: Negative for back pain. Neurological: Negative for headaches   ____________________________________________   PHYSICAL EXAM:  VITAL SIGNS: ED Triage Vitals  Enc Vitals Group     BP 12/04/16 2057 140/69     Pulse Rate 12/04/16 2057 60     Resp 12/04/16 2057 20  Temp 12/04/16 2057 98.3 F (36.8 C)     Temp Source 12/04/16 2057 Oral     SpO2 12/04/16 2057 100 %     Weight 12/04/16 2058 240 lb (108.9 kg)     Height 12/04/16 2058 5\' 6"  (1.676 m)     Head Circumference --      Peak Flow --      Pain Score 12/04/16 2057 6     Pain Loc --      Pain Edu? --      Excl. in [place]? --     Constitutional: Alert and oriented 4 pleasant cooperative speaks in full clear sentences no diaphoresis Head: Atraumatic. Nose: Some  rhinorrhea Mouth/Throat: No trismus Neck: No stridor.   Cardiovascular: Regular rate and rhythm Respiratory: Normal respiratory effort.  No retractions. Clear to auscultation bilaterally Gastrointestinal: Obese soft nontender Neurologic:  Normal speech and language. No gross focal neurologic deficits are appreciated.  Skin:  Skin is warm, dry and intact. No rash noted.    ____________________________________________  LABS (all labs ordered are listed, but only abnormal results are displayed)  Labs Reviewed  COMPREHENSIVE METABOLIC PANEL - Abnormal; Notable for the following components:      Result Value   Glucose, Bld 159 (*)    Calcium 8.8 (*)    All other components within normal limits  CBC  TROPONIN I    Labwork reviewed by me is unremarkable __________________________________________  EKG  ED ECG REPORT I, Darel Hong, the attending physician, personally viewed and interpreted this ECG.  Date: 12/04/2016 EKG Time:  Rate: 68 Rhythm: normal sinus rhythm QRS Axis: normal Intervals: normal ST/T Wave abnormalities: normal Narrative Interpretation: no evidence of acute ischemia  ____________________________________________  RADIOLOGY  Chest x-ray reviewed by me is unremarkable ____________________________________________   DIFFERENTIAL includes but not limited to  Upper respiratory tract infection, influenza, postviral cough, pneumonia, bronchitis   PROCEDURES  Procedure(s) performed: no  Procedures  Critical Care performed: no  Observation: no ____________________________________________   INITIAL IMPRESSION / ASSESSMENT AND PLAN / ED COURSE  Pertinent labs & imaging results that were available during my care of the patient were reviewed by me and considered in my medical decision making (see chart for details).  The patient is well-appearing with clear lungs and a normal chest x-ray, however her history is clearly of a resolving upper  respiratory tract infection that is acutely worsened which is concerning for superinfection with bacterial pneumonia. I discussed antibiotics versus watchful waiting and the patient would prefer a Z-Pak now which I think is reasonable. Strict return precautions have been given and the patient is discharged home in improved condition.      ____________________________________________   FINAL CLINICAL IMPRESSION(S) / ED DIAGNOSES  Final diagnoses:  Community acquired pneumonia, unspecified laterality      NEW MEDICATIONS STARTED DURING THIS VISIT:  This SmartLink is deprecated. Use AVSMEDLIST instead to display the medication list for a patient.   Note:  This document was prepared using Dragon voice recognition software and may include unintentional dictation errors.      Darel Hong, MD 12/06/16 [phone]

## 2016-12-04 NOTE — ED Triage Notes (Signed)
Pt in with co cold symptoms for over a week has tried otc meds without relief. Now co upper back pain radiates to mid chest. Pt has been coughing and has shob at times.

## 2016-12-04 NOTE — Discharge Instructions (Signed)
Please take all of your antibiotics as prescribed and follow-up with your primary care physician as needed.  Return to the emergency department sooner for any concerns whatsoever.  It was a pleasure to take care of you today, and thank you for coming to our emergency department.  If you have any questions or concerns before leaving please ask the nurse to grab me and I'm more than happy to go through your aftercare instructions again.  If you were prescribed any opioid pain medication today such as Norco, Vicodin, Percocet, morphine, hydrocodone, or oxycodone please make sure you do not drive when you are taking this medication as it can alter your ability to drive safely.  If you have any concerns once you are home that you are not improving or are in fact getting worse before you can make it to your follow-up appointment, please do not hesitate to call 911 and come back for further evaluation.  Darel Hong, MD  Results for orders placed or performed during the hospital encounter of 12/04/16  CBC  Result Value Ref Range   WBC 8.7 3.6 - 11.0 K/uL   RBC 4.22 3.80 - 5.20 MIL/uL   Hemoglobin 12.6 12.0 - 16.0 g/dL   HCT 38.6 35.0 - 47.0 %   MCV 91.4 80.0 - 100.0 fL   MCH 29.8 26.0 - 34.0 pg   MCHC 32.6 32.0 - 36.0 g/dL   RDW 13.9 11.5 - 14.5 %   Platelets 404 150 - 440 K/uL  Comprehensive metabolic panel  Result Value Ref Range   Sodium 139 135 - 145 mmol/L   Potassium 3.7 3.5 - 5.1 mmol/L   Chloride 105 101 - 111 mmol/L   CO2 25 22 - 32 mmol/L   Glucose, Bld 159 (H) 65 - 99 mg/dL   BUN 11 6 - 20 mg/dL   Creatinine, Ser 0.92 0.44 - 1.00 mg/dL   Calcium 8.8 (L) 8.9 - 10.3 mg/dL   Total Protein 7.1 6.5 - 8.1 g/dL   Albumin 3.5 3.5 - 5.0 g/dL   AST 33 15 - 41 U/L   ALT 28 14 - 54 U/L   Alkaline Phosphatase 123 38 - 126 U/L   Total Bilirubin 0.3 0.3 - 1.2 mg/dL   GFR calc non Af Amer >60 >60 mL/min   GFR calc Af Amer >60 >60 mL/min   Anion gap 9 5 - 15  Troponin I  Result Value  Ref Range   Troponin I <0.03 <0.03 ng/mL

## 2016-12-04 NOTE — ED Notes (Signed)
Pt signed hard copy of ED discharge and placed on chart

## 2016-12-04 NOTE — ED Notes (Signed)

## 2017-02-07 ENCOUNTER — Other Ambulatory Visit: Payer: Self-pay | Admitting: Obstetrics & Gynecology

## 2017-02-09 ENCOUNTER — Other Ambulatory Visit: Payer: Self-pay | Admitting: Family Medicine

## 2017-02-12 ENCOUNTER — Other Ambulatory Visit: Payer: Self-pay | Admitting: Family Medicine

## 2017-02-12 DIAGNOSIS — Z1239 Encounter for other screening for malignant neoplasm of breast: Secondary | ICD-10-CM

## 2017-03-26 ENCOUNTER — Ambulatory Visit
Admission: RE | Admit: 2017-03-26 | Discharge: 2017-03-26 | Disposition: A | Discharge status: Home / Self Care | Payer: Medicaid Other | Source: Ambulatory Visit | Attending: Family Medicine | Admitting: Family Medicine

## 2017-03-26 DIAGNOSIS — Z1239 Encounter for other screening for malignant neoplasm of breast: Secondary | ICD-10-CM

## 2017-03-26 DIAGNOSIS — Z1231 Encounter for screening mammogram for malignant neoplasm of breast: Secondary | ICD-10-CM | POA: Insufficient documentation

## 2017-06-15 ENCOUNTER — Other Ambulatory Visit: Payer: Self-pay | Admitting: Family Medicine

## 2017-06-15 ENCOUNTER — Ambulatory Visit
Admission: RE | Admit: 2017-06-15 | Discharge: 2017-06-15 | Disposition: A | Discharge status: Home / Self Care | Payer: Medicaid Other | Source: Ambulatory Visit | Attending: Family Medicine | Admitting: Family Medicine

## 2017-06-15 DIAGNOSIS — M25562 Pain in left knee: Secondary | ICD-10-CM | POA: Insufficient documentation

## 2017-06-15 DIAGNOSIS — G8929 Other chronic pain: Secondary | ICD-10-CM

## 2018-01-31 ENCOUNTER — Ambulatory Visit: Payer: Medicaid Other | Admitting: Obstetrics and Gynecology

## 2018-01-31 ENCOUNTER — Encounter: Payer: Self-pay | Admitting: Obstetrics and Gynecology

## 2018-01-31 VITALS — BP 138/89 | HR 92 | Ht 65.0 in | Wt 242.8 lb

## 2018-01-31 DIAGNOSIS — N951 Menopausal and female climacteric states: Secondary | ICD-10-CM | POA: Diagnosis not present

## 2018-01-31 DIAGNOSIS — R102 Pelvic and perineal pain: Secondary | ICD-10-CM | POA: Diagnosis not present

## 2018-01-31 DIAGNOSIS — R1030 Lower abdominal pain, unspecified: Secondary | ICD-10-CM

## 2018-01-31 LAB — POCT URINALYSIS DIPSTICK
Bilirubin, UA: NEGATIVE
Glucose, UA: NEGATIVE
Ketones, UA: NEGATIVE
Leukocytes, UA: NEGATIVE
Nitrite, UA: NEGATIVE
PROTEIN UA: NEGATIVE
Spec Grav, UA: 1.01 (ref 1.010–1.025)
Urobilinogen, UA: 0.2 E.U./dL
pH, UA: 5 (ref 5.0–8.0)

## 2018-01-31 NOTE — Progress Notes (Signed)
HPI:      [name] is a 48 y.o. No obstetric history on file. who LMP was Patient's last menstrual period was 04/08/2015 (exact date).  Subjective:   She presents today with complaint of persistent pain at the vaginal cuff since hysterectomy.  Pain is especially bad with intercourse.  Occasionally urination causes the pain. Patient also complains of daily hot flashes and nightly hot flashes.  Thinks she may be in menopause.    Hx: The following portions of the patient's history were reviewed and updated as appropriate:             She  has no past medical history on file. She does not have a problem list on file. She  has a past surgical history that includes Cesarean section; Tonsillectomy; laparoscopy; Hysteroscopy; Laparoscopic hysterectomy (N/A, 04/13/2015); Laparoscopic bilateral salpingectomy (Bilateral, 09/15/2227); and Umbilical hernia repair (04/13/2015). Her family history is not on file. She  reports that she has never smoked. She has never used smokeless tobacco. She reports current alcohol use. She reports that she does not use drugs. She has a current medication list which includes the following prescription(s): ibuprofen and loratadine. She has No Known Allergies.       Review of Systems:  Review of Systems  Constitutional: Denied constitutional symptoms, night sweats, recent illness, fatigue, fever, insomnia and weight loss.  Eyes: Denied eye symptoms, eye pain, photophobia, vision change and visual disturbance.  Ears/Nose/Throat/Neck: Denied ear, nose, throat or neck symptoms, hearing loss, nasal discharge, sinus congestion and sore throat.  Cardiovascular: Denied cardiovascular symptoms, arrhythmia, chest pain/pressure, edema, exercise intolerance, orthopnea and palpitations.  Respiratory: Denied pulmonary symptoms, asthma, pleuritic pain, productive sputum, cough, dyspnea and wheezing.  Gastrointestinal: Denied, gastro-esophageal reflux, melena, nausea and  vomiting.  Genitourinary: See HPI for additional information.  Musculoskeletal: Denied musculoskeletal symptoms, stiffness, swelling, muscle weakness and myalgia.  Dermatologic: Denied dermatology symptoms, rash and scar.  Neurologic: Denied neurology symptoms, dizziness, headache, neck pain and syncope.  Psychiatric: Denied psychiatric symptoms, anxiety and depression.  Endocrine: Denied endocrine symptoms including hot flashes and night sweats.   Meds:   Current Outpatient Medications on File Prior to Visit  Medication Sig Dispense Refill  . ibuprofen (ADVIL,MOTRIN) 600 MG tablet Take 1 tablet (600 mg total) by mouth every 6 (six) hours as needed. 91 tablet 0  . loratadine (CLARITIN) 10 MG tablet Take 10 mg by mouth daily as needed for allergies.     No current facility-administered medications on file prior to visit.     Objective:     Vitals:   01/31/18 1113  BP: 138/89  Pulse: 92              Physical examination    Pelvic:   Vulva: Normal appearance.  No lesions.   Vagina: No lesions or abnormalities noted.  No pain at the vaginal cuff.  No masses  Support: Normal pelvic support.  Urethra No masses tenderness or scarring.  Meatus Normal size without lesions or prolapse.  Cervix: Surgically absent   Anus: Normal exam.  No lesions.  Perineum: Normal exam.  No lesions.        Bimanual   Uterus: Surgically absent   Adnexae: No masses.  Non-tender to palpation.  Cul-de-sac: Negative for abnormality.     Assessment:    No obstetric history on file. There are no active problems to display for this patient.    1. Pelvic pain in female   2. Lower abdominal pain  3. Symptomatic menopausal or female climacteric states     Patient localizes the pain to the vaginal cuff area but this is not consistent with her exam.  Abdominal palpation in the lower midline is more painful to her.  Op note reviewed, pathology reviewed patient had uterine fibroids and  adenomyosis.   Plan:            1.  Ultrasound of the vaginal cuff to rule out adhesions of bowel or ovary.  2.  FSH to rule out menopause  3.  Urine sent for C&S because of her recent symptoms and midline pelvic pain. Orders Orders Placed This Encounter  Procedures  . Urine Culture  . POCT urinalysis dipstick    No orders of the defined types were placed in this encounter.     F/U  Return in about 2 weeks (around 02/14/2018). I spent 33 minutes involved in the care of this patient of which greater than 50% was spent discussing pelvic pain, possible causes and treatments, menopausal symptoms, urinary tract infection and symptoms.  Adenomyosis and endometriosis pelvic pain.  All questions answered.  Finis Bud, M.D. 01/31/2018 11:46 AM

## 2018-01-31 NOTE — Progress Notes (Signed)
Patient comes in today for a new GYN appointment. Patient is having lower back pain and abdominal bloating. She had Hysterectomy in 2017. She would like to discuss having internal pain at the internal incision site.

## 2018-02-01 LAB — FOLLICLE STIMULATING HORMONE: FSH: 37.1 m[IU]/mL

## 2018-02-02 LAB — URINE CULTURE

## 2018-02-14 ENCOUNTER — Ambulatory Visit (INDEPENDENT_AMBULATORY_CARE_PROVIDER_SITE_OTHER): Payer: Medicaid Other

## 2018-02-14 ENCOUNTER — Telehealth: Payer: Self-pay

## 2018-02-14 DIAGNOSIS — R102 Pelvic and perineal pain: Secondary | ICD-10-CM | POA: Diagnosis not present

## 2018-02-14 NOTE — Telephone Encounter (Signed)
Pt aware. Appt made for 2/12 at 3pm.

## 2018-02-14 NOTE — Telephone Encounter (Signed)
[facility]  Per DJE pts u/s showed thet bowel cuff is stuck to the vaginal cuff. Per DJE she needs a f/u appt to discuss her options.

## 2018-02-20 ENCOUNTER — Ambulatory Visit (INDEPENDENT_AMBULATORY_CARE_PROVIDER_SITE_OTHER): Payer: Medicaid Other | Admitting: Obstetrics and Gynecology

## 2018-02-20 ENCOUNTER — Encounter: Payer: Self-pay | Admitting: Obstetrics and Gynecology

## 2018-02-20 VITALS — BP 126/75 | HR 87 | Ht 66.0 in | Wt 243.9 lb

## 2018-02-20 DIAGNOSIS — R102 Pelvic and perineal pain: Secondary | ICD-10-CM | POA: Diagnosis not present

## 2018-02-20 NOTE — Progress Notes (Signed)
HPI:      [name] is a 48 y.o. No obstetric history on file. who LMP was Patient's last menstrual period was 04/08/2015 (exact date).  Subjective:   She presents today patient has been having pelvic pain since her hysterectomy.  The pain seems localized to the vaginal cuff at her last appointment.  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 Patient states that she cannot have surgery at this time.    Hx: The following portions of the patient's history were reviewed and updated as appropriate:             She  has no past medical history on file. She does not have a problem list on file. She  has a past surgical history that includes Cesarean section; Tonsillectomy; laparoscopy; Hysteroscopy; Laparoscopic hysterectomy (N/A, 04/13/2015); Laparoscopic bilateral salpingectomy (Bilateral, 02/12/5359); and Umbilical hernia repair (04/13/2015). Her family history is not on file. She  reports that she has never smoked. She has never used smokeless tobacco. She reports current alcohol use. She reports that she does not use drugs. She has a current medication list which includes the following prescription(s): ibuprofen and loratadine. She has No Known Allergies.       Review of Systems:  Review of Systems  Constitutional: Denied constitutional symptoms, night sweats, recent illness, fatigue, fever, insomnia and weight loss.  Eyes: Denied eye symptoms, eye pain, photophobia, vision change and visual disturbance.  Ears/Nose/Throat/Neck: Denied ear, nose, throat or neck symptoms, hearing loss, nasal discharge, sinus congestion and sore throat.  Cardiovascular: Denied cardiovascular symptoms, arrhythmia, chest pain/pressure, edema, exercise intolerance, orthopnea and palpitations.  Respiratory: Denied pulmonary symptoms, asthma, pleuritic pain, productive sputum, cough, dyspnea and wheezing.  Gastrointestinal: Denied,  gastro-esophageal reflux, melena, nausea and vomiting.  Genitourinary: See HPI for additional information.  Musculoskeletal: Denied musculoskeletal symptoms, stiffness, swelling, muscle weakness and myalgia.  Dermatologic: Denied dermatology symptoms, rash and scar.  Neurologic: Denied neurology symptoms, dizziness, headache, neck pain and syncope.  Psychiatric: Denied psychiatric symptoms, anxiety and depression.  Endocrine: Denied endocrine symptoms including hot flashes and night sweats.   Meds:   Current Outpatient Medications on File Prior to Visit  Medication Sig Dispense Refill  . ibuprofen (ADVIL,MOTRIN) 600 MG tablet Take 1 tablet (600 mg total) by mouth every 6 (six) hours as needed. 91 tablet 0  . loratadine (CLARITIN) 10 MG tablet Take 10 mg by mouth daily as needed for allergies.     No current facility-administered medications on file prior to visit.     Objective:     Vitals:   02/20/18 1508  BP: 126/75  Pulse: 87              Ultrasound results reviewed directly with the patient.  Multiple pictures drawn and options discussed in detail with the risk benefits of each reviewed  Assessment:    No obstetric history on file. There are no active problems to display for this patient.    1. 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  Her symptom profile also fits this diagnosis.  Plan:            1.  I have discussed this in detail and have discussed multiple possibilities including expectant management although I doubt that her symptoms will change.  Laparoscopic management, possible laparotomy, possible general surgery involvement should bowel be  densely adherent.  All of her questions were answered and she will inform us when she is reached a decision regarding the future of her care and her pelvic pain. Orders No orders of the defined types were placed in this encounter.   No orders  of the defined types were placed in this encounter.     F/U  Return for Pt to contact us if symptoms worsen. I spent 18 minutes involved in the care of this patient of which greater than 50% was spent discussing because of her pelvic pain, vaginal cuff adhesions, possible surgical options, all questions answered.  Finis Bud, M.D. 02/20/2018 4:07 PM

## 2018-02-20 NOTE — Progress Notes (Signed)
Patient comes in today to discuss [place] results from 02/14/2018.

## 2018-07-22 ENCOUNTER — Other Ambulatory Visit: Payer: Self-pay | Admitting: Family Medicine

## 2018-09-20 ENCOUNTER — Other Ambulatory Visit (HOSPITAL_COMMUNITY): Payer: Self-pay | Admitting: Family Medicine

## 2018-09-20 ENCOUNTER — Other Ambulatory Visit: Payer: Self-pay | Admitting: Family Medicine

## 2018-09-20 DIAGNOSIS — R7989 Other specified abnormal findings of blood chemistry: Secondary | ICD-10-CM

## 2018-09-30 ENCOUNTER — Ambulatory Visit
Admission: RE | Admit: 2018-09-30 | Discharge: 2018-09-30 | Disposition: A | Discharge status: Home / Self Care | Payer: Medicaid Other | Source: Ambulatory Visit | Attending: Family Medicine | Admitting: Family Medicine

## 2018-09-30 ENCOUNTER — Other Ambulatory Visit: Payer: Self-pay

## 2018-09-30 DIAGNOSIS — R945 Abnormal results of liver function studies: Secondary | ICD-10-CM | POA: Diagnosis present

## 2018-09-30 DIAGNOSIS — R7989 Other specified abnormal findings of blood chemistry: Secondary | ICD-10-CM

## 2018-11-18 ENCOUNTER — Other Ambulatory Visit: Payer: Self-pay | Admitting: Family Medicine

## 2018-11-18 DIAGNOSIS — N644 Mastodynia: Secondary | ICD-10-CM

## 2018-11-27 ENCOUNTER — Other Ambulatory Visit: Payer: Self-pay

## 2018-11-27 DIAGNOSIS — Z20822 Contact with and (suspected) exposure to covid-19: Secondary | ICD-10-CM

## 2018-11-29 LAB — NOVEL CORONAVIRUS, NAA: SARS-CoV-2, NAA: NOT DETECTED

## 2019-01-13 ENCOUNTER — Ambulatory Visit: Payer: Medicaid Other | Attending: Internal Medicine

## 2019-01-13 DIAGNOSIS — Z20822 Contact with and (suspected) exposure to covid-19: Secondary | ICD-10-CM

## 2019-01-14 LAB — NOVEL CORONAVIRUS, NAA: SARS-CoV-2, NAA: NOT DETECTED

## 2019-03-05 IMAGING — CR DG KNEE 3 VIEWS*L*
3 series · 3 of 3 positions shown · non-contrast
Comparison: None

CLINICAL DATA: LEFT knee pain and cracking sound when walking up
and down stairs for 1 year

EXAM:
LEFT KNEE - 3 VIEW

[knee ap]
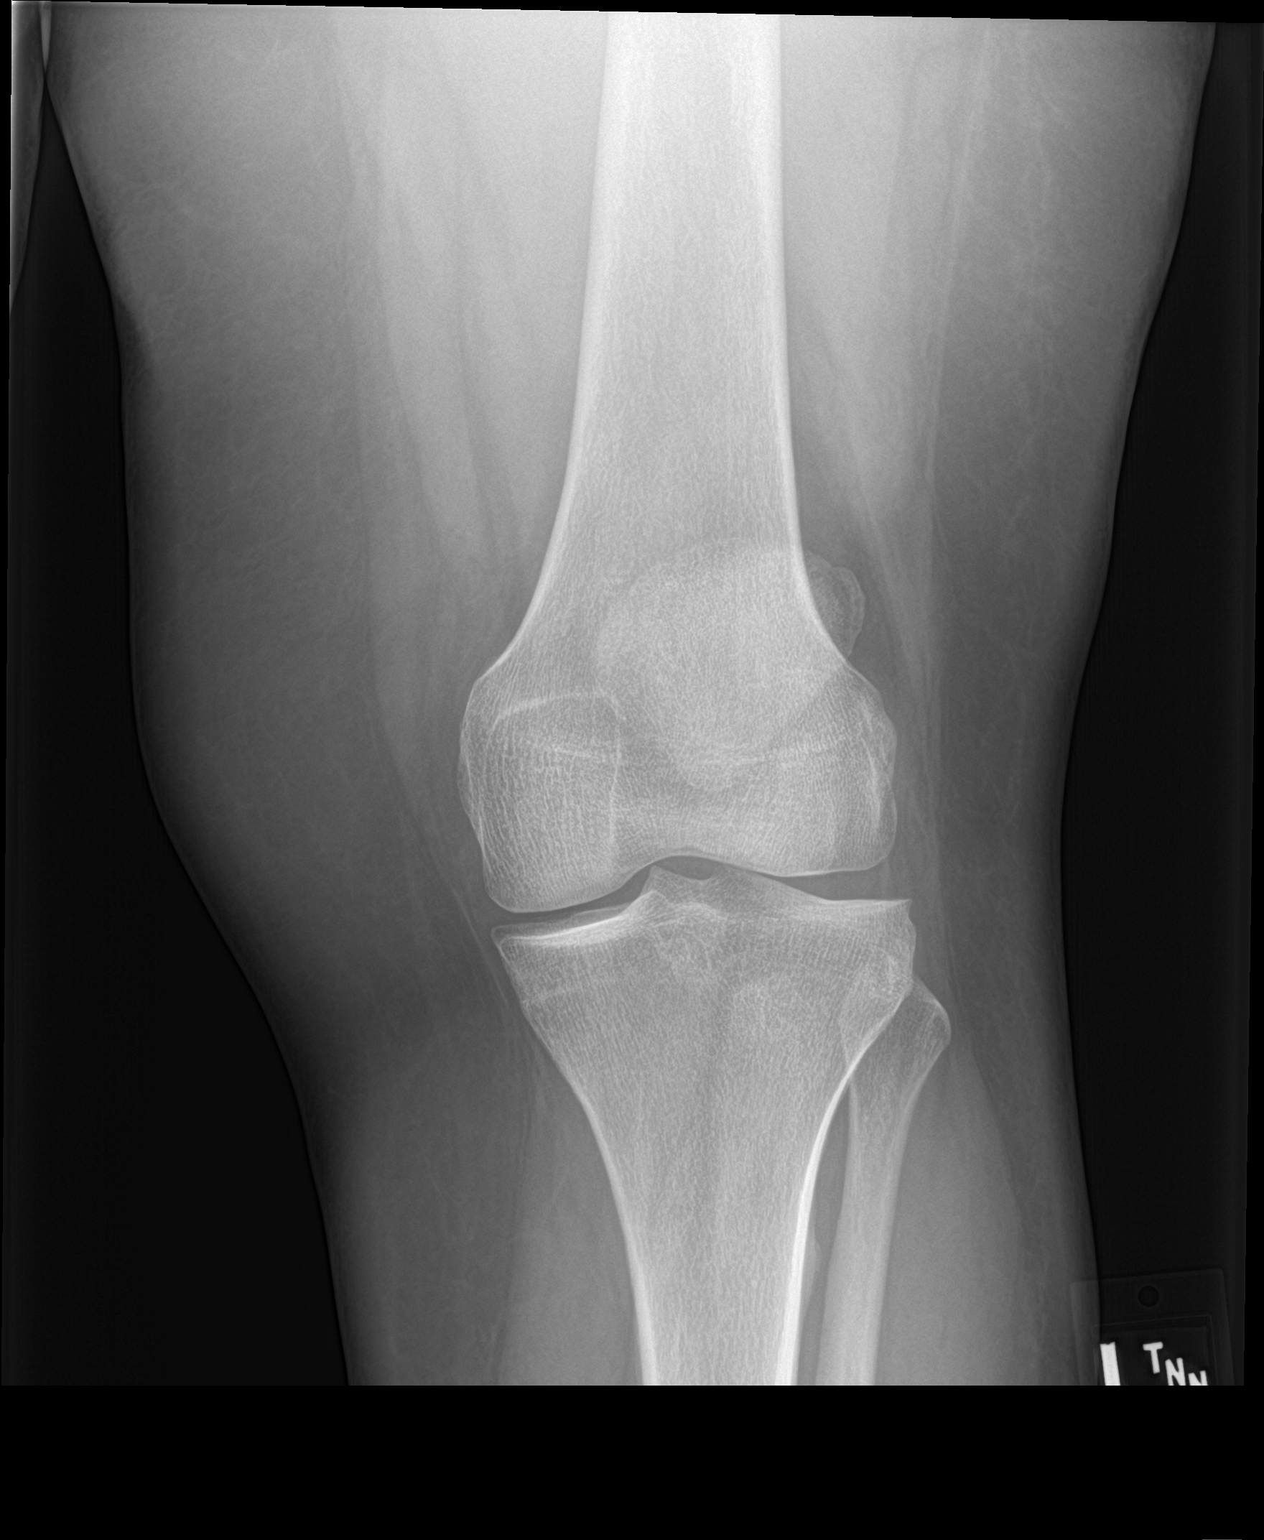

[knee lat]
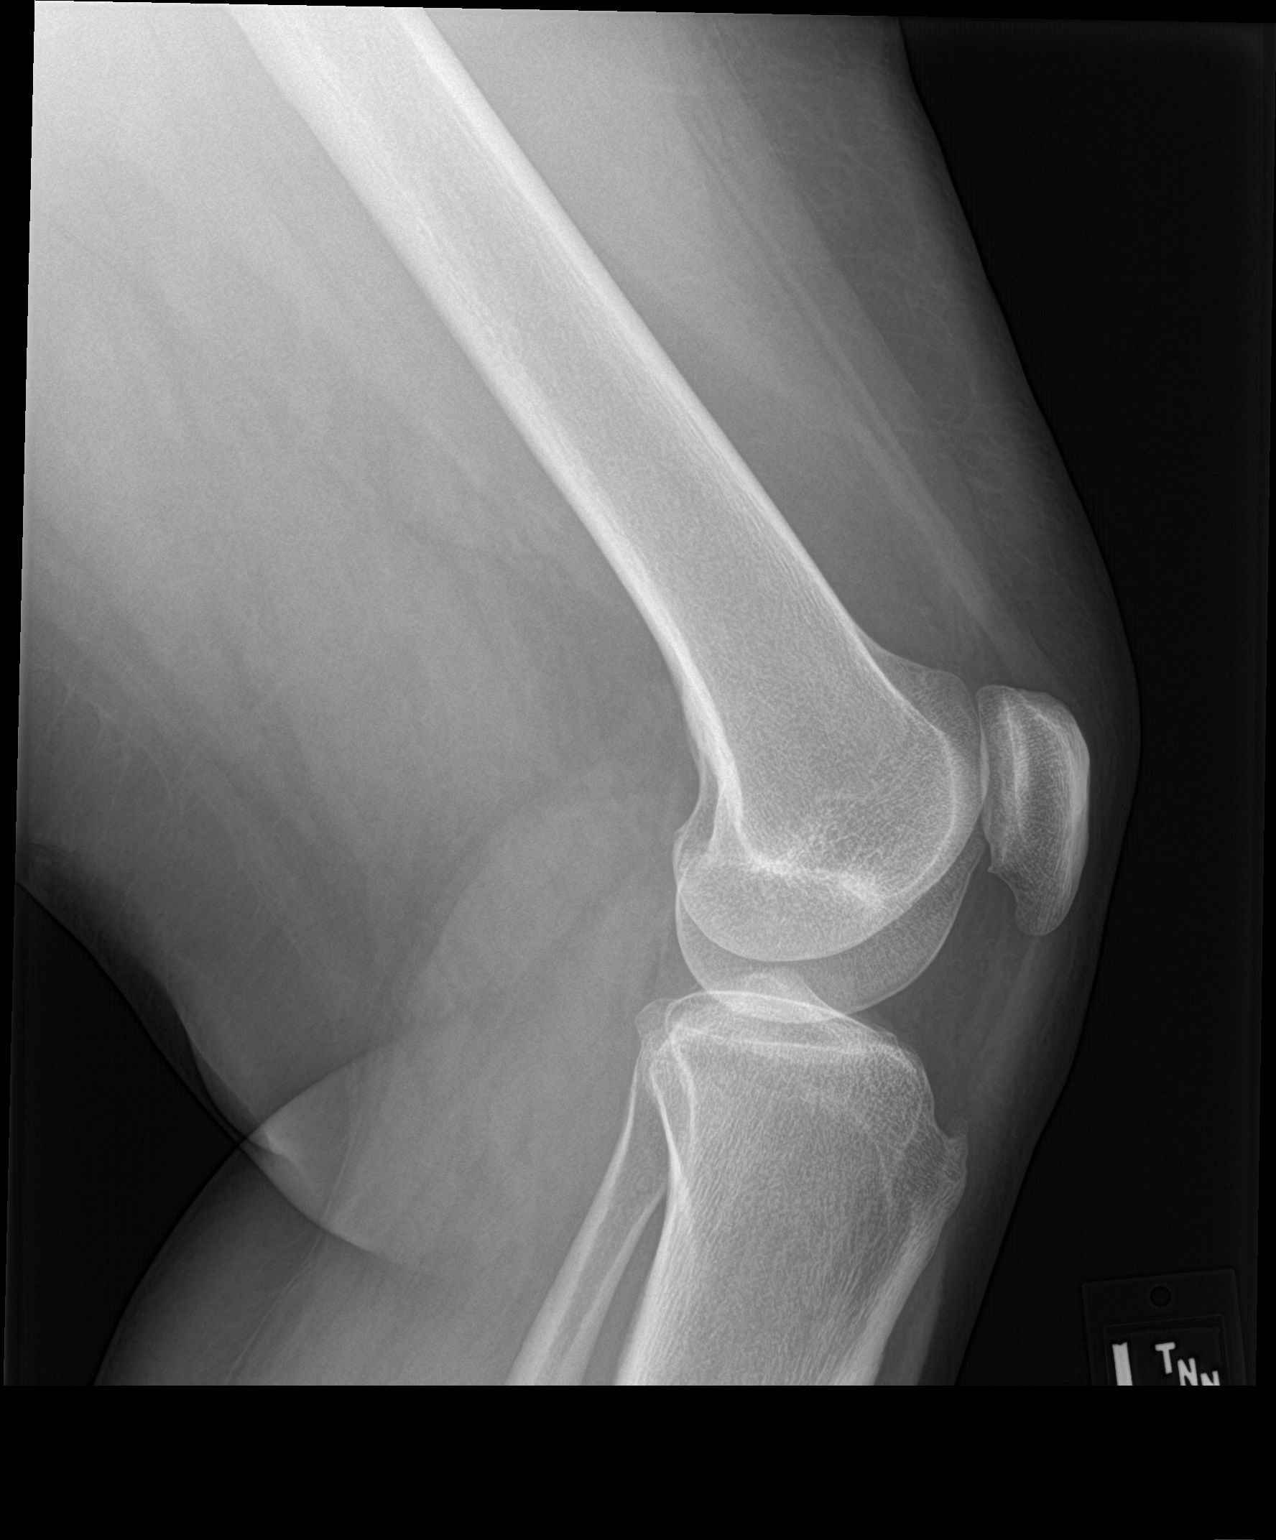

[sunrise]
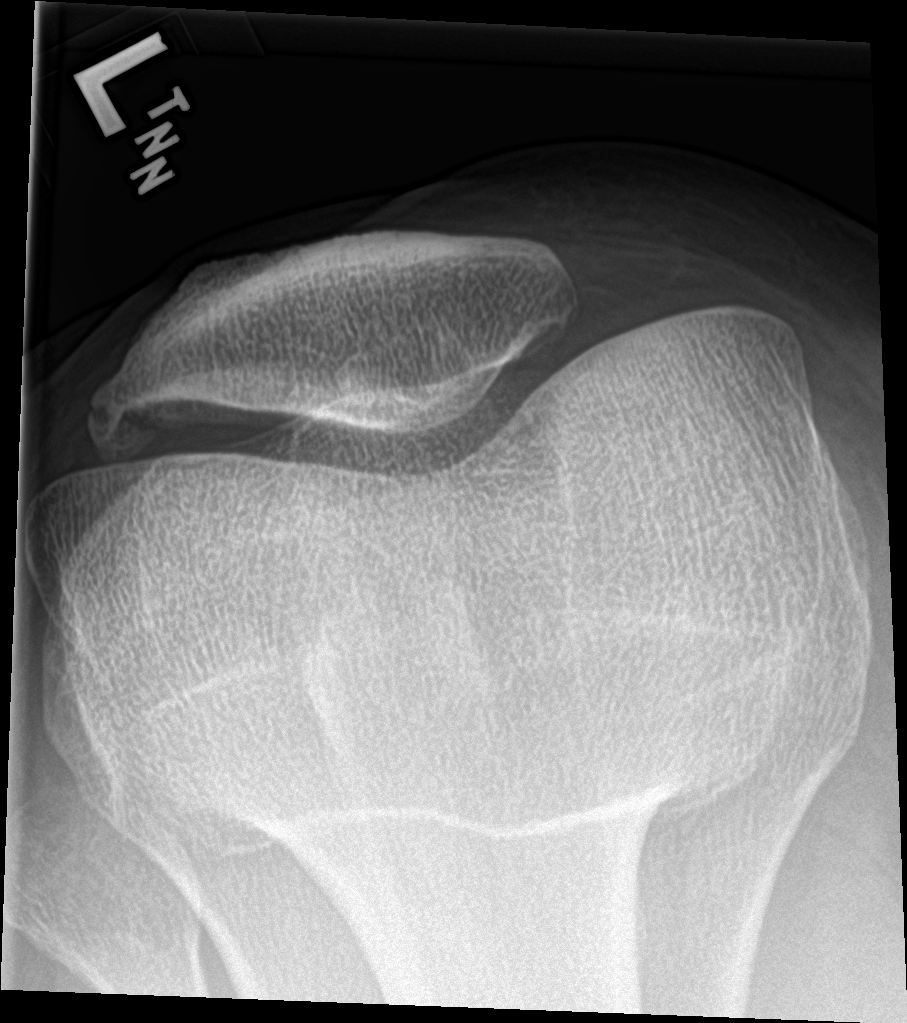

[3 of 3 positions shown; findings below may reference images not displayed]

FINDINGS: Osseous mineralization normal.

Mild medial compartment joint space narrowing.

No acute fracture, dislocation, or bone destruction.

Patellar spur formation especially the lateral margin of the lateral
patellar facet.

No knee joint effusion.
IMPRESSION: Degenerative changes LEFT knee without acute bony abnormalities.

## 2019-04-07 ENCOUNTER — Ambulatory Visit: Payer: Medicaid Other | Attending: Internal Medicine

## 2019-04-07 DIAGNOSIS — Z23 Encounter for immunization: Secondary | ICD-10-CM

## 2019-04-07 NOTE — Progress Notes (Signed)
   Covid-19 Vaccination Clinic  Name:  Melanie Krause    MRN: [id number] DOB: 24-Jul-1970  04/07/2019  Melanie Krause was observed post Covid-19 immunization for 15 minutes without incident. She was provided with Vaccine Information Sheet and instruction to access the V-Safe system.   Melanie Krause was instructed to call 911 with any severe reactions post vaccine: Marland Kitchen Difficulty breathing  . Swelling of face and throat  . A fast heartbeat  . A bad rash all over body  . Dizziness and weakness   Immunizations Administered    Name Date Dose VIS Date Route   Pfizer COVID-19 Vaccine 04/07/2019  3:33 PM 0.3 mL 12/20/2018 Intramuscular   Manufacturer: [place]   Lot: [id number]   [place]: [id number]

## 2019-04-30 ENCOUNTER — Ambulatory Visit: Payer: Medicaid Other | Attending: Internal Medicine

## 2019-04-30 DIAGNOSIS — Z23 Encounter for immunization: Secondary | ICD-10-CM

## 2019-04-30 NOTE — Progress Notes (Signed)
   Covid-19 Vaccination Clinic  Name:  Melanie Krause    MRN: [id number] DOB: [birth year]  04/30/2019  [name] was observed post Covid-19 immunization for 15 minutes without incident. She was provided with Vaccine Information Sheet and instruction to access the V-Safe system.   [name] was instructed to call 911 with any severe reactions post vaccine: Marland Kitchen Difficulty breathing  . Swelling of face and throat  . A fast heartbeat  . A bad rash all over body  . Dizziness and weakness   Immunizations Administered    Name Date Dose VIS Date Route   Pfizer COVID-19 Vaccine 04/30/2019  3:54 PM 0.3 mL 03/05/2018 Intramuscular   Manufacturer: [place]   Lot: [id number]   [place]: [id number]

## 2020-07-25 ENCOUNTER — Emergency Department
Admission: EM | Admit: 2020-07-25 | Discharge: 2020-07-25 | Disposition: A | Discharge status: Home / Self Care | Payer: Medicaid Other | Attending: Emergency Medicine | Admitting: Emergency Medicine

## 2020-07-25 ENCOUNTER — Other Ambulatory Visit: Payer: Self-pay

## 2020-07-25 DIAGNOSIS — M5441 Lumbago with sciatica, right side: Secondary | ICD-10-CM

## 2020-07-25 DIAGNOSIS — M25551 Pain in right hip: Secondary | ICD-10-CM | POA: Diagnosis present

## 2020-07-25 MED ORDER — PREDNISONE 10 MG (21) PO TBPK: ORAL_TABLET | ORAL | 0 refills | Status: AC

## 2020-07-25 MED ORDER — METHOCARBAMOL 500 MG PO TABS: 500.0000 mg | ORAL_TABLET | Freq: Three times a day (TID) | ORAL | 0 refills | Status: AC | PRN

## 2020-07-25 NOTE — ED Notes (Signed)
Pt discharged home with family

## 2020-07-25 NOTE — ED Triage Notes (Signed)
Pt in with co right hip pain that is worse when she walks or moves. Took ibuprofen with some relief. PT state has had it for weeks, but today is worsened.

## 2020-07-25 NOTE — ED Provider Notes (Signed)
[facility]-EMERGENCY DEPARTMENT  ____________________________________________  Time seen: Approximately 11:43 PM  I have reviewed the triage vital signs and the nursing notes.   HISTORY  Chief Complaint Back Pain and Hip Pain   Historian Patient     HPI Melanie Krause is a 50 y.o. female presents to the emergency department with right lateral hip pain that radiates along the lateral aspect of the right leg.  Patient denies falls or mechanisms of trauma.  She reports that she has had pain intermittently for the past several weeks.  No dysuria, hematuria or increased urinary frequency.  Patient denies possibility of pregnancy stating that she has had a hysterectomy.  No bowel or bladder incontinence or saddle anesthesia.  No fever at home.   No past medical history on file.   Immunizations up to date:  Yes.     No past medical history on file.  There are no problems to display for this patient.   Past Surgical History:  Procedure Laterality Date   CESAREAN SECTION     HYSTEROSCOPY     LAPAROSCOPIC BILATERAL SALPINGECTOMY Bilateral 04/13/2015   Procedure: LAPAROSCOPIC BILATERAL SALPINGECTOMY;  Surgeon: Honor Loh Ward, MD;  Location: [facility] ORS;  Service: Gynecology;  Laterality: Bilateral;   LAPAROSCOPIC HYSTERECTOMY N/A 04/13/2015   Procedure: HYSTERECTOMY TOTAL LAPAROSCOPIC;  Surgeon: Honor Loh Ward, MD;  Location: [facility] ORS;  Service: Gynecology;  Laterality: N/A;   laparoscopy     TONSILLECTOMY     UMBILICAL HERNIA REPAIR  04/13/2015   Procedure: HERNIA REPAIR UMBILICAL ADULT;  Surgeon: Honor Loh Ward, MD;  Location: [facility] ORS;  Service: Gynecology;;    Prior to Admission medications   Medication Sig Start Date End Date Taking? Authorizing Provider  methocarbamol (ROBAXIN) 500 MG tablet Take 1 tablet (500 mg total) by mouth every 8 (eight) hours as needed for up to 5 days. 07/25/20 07/30/20 Yes Vallarie Mare M, PA-C  predniSONE (STERAPRED UNI-PAK 21 TAB) 10 MG (21) TBPK  tablet Take 6 tablets the first day, take 5 tablets the second day, take 4 tablets the third day, take 3 tablets the fourth day, take 2 tablets the fifth day, take 1 tablet the sixth day. 07/25/20  Yes Vallarie Mare M, PA-C  ibuprofen (ADVIL,MOTRIN) 600 MG tablet Take 1 tablet (600 mg total) by mouth every 6 (six) hours as needed. 04/13/15   Ward, Honor Loh, MD  loratadine (CLARITIN) 10 MG tablet Take 10 mg by mouth daily as needed for allergies.    [provider]    Allergies Patient has no known allergies.  Family History  Problem Relation Age of Onset   Breast cancer Neg Hx     Social History Social History   Tobacco Use   Smoking status: Never   Smokeless tobacco: Never  Substance Use Topics   Alcohol use: Yes    Comment: occassional   Drug use: No     Review of Systems  Constitutional: No fever/chills Eyes:  No discharge ENT: No upper respiratory complaints. Respiratory: no cough. No SOB/ use of accessory muscles to breath Gastrointestinal:   No nausea, no vomiting.  No diarrhea.  No constipation. Musculoskeletal: Patient has right hip pain. Skin: Negative for rash, abrasions, lacerations, ecchymosis.   ____________________________________________   PHYSICAL EXAM:  VITAL SIGNS: ED Triage Vitals  Enc Vitals Group     BP 07/25/20 2333 121/72     Pulse Rate 07/25/20 2333 (!) 56     Resp 07/25/20 2333 20     Temp  07/25/20 2336 98.3 F (36.8 C)     Temp Source 07/25/20 2336 Oral     SpO2 07/25/20 2333 97 %     Weight 07/25/20 2335 264 lb (119.7 kg)     Height 07/25/20 2335 5\' 6"  (1.676 m)     Head Circumference --      Peak Flow --      Pain Score 07/25/20 2333 5     Pain Loc --      Pain Edu? --      Excl. in [place]? --      Constitutional: Alert and oriented. Well appearing and in no acute distress. Eyes: Conjunctivae are normal. PERRL. EOMI. Head: Atraumatic. ENT: Cardiovascular: Normal rate, regular rhythm. Normal S1 and S2.  Good peripheral  circulation. Respiratory: Normal respiratory effort without tachypnea or retractions. Lungs CTAB. Good air entry to the bases with no decreased or absent breath sounds Gastrointestinal: Bowel sounds x 4 quadrants. Soft and nontender to palpation. No guarding or rigidity. No distention. Musculoskeletal: Full range of motion to all extremities. No obvious deformities noted.  Positive straight leg raise test, right.  No pain with internal and external rotation of the right hip. Neurologic:  Normal for age. No gross focal neurologic deficits are appreciated.  Skin:  Skin is warm, dry and intact. No rash noted. Psychiatric: Mood and affect are normal for age. Speech and behavior are normal.   ____________________________________________   LABS (all labs ordered are listed, but only abnormal results are displayed)  Labs Reviewed - No data to display ____________________________________________  EKG   ____________________________________________  RADIOLOGY   No results found.  ____________________________________________    PROCEDURES  Procedure(s) performed:     Procedures     Medications - No data to display   ____________________________________________   INITIAL IMPRESSION / ASSESSMENT AND PLAN / ED COURSE  Pertinent labs & imaging results that were available during my care of the patient were reviewed by me and considered in my medical decision making (see chart for details).      Assessment and plan Lumbar radiculopathy.  50 year old female presents to the emergency department with acute right hip pain for the past 2 to 3 weeks  Patient was mildly bradycardic at triage but vital signs otherwise reassuring.  She was alert, active and nontoxic-appearing with a positive straight leg raise test.  Patient was discharged with tapered prednisone and Robaxin.  Return precautions were given to return with new or worsening  symptoms.   ____________________________________________  FINAL CLINICAL IMPRESSION(S) / ED DIAGNOSES  Final diagnoses:  Acute bilateral low back pain with right-sided sciatica      NEW MEDICATIONS STARTED DURING THIS VISIT:  ED Discharge Orders          Ordered    predniSONE (STERAPRED UNI-PAK 21 TAB) 10 MG (21) TBPK tablet        07/25/20 2336    methocarbamol (ROBAXIN) 500 MG tablet  Every 8 hours PRN        07/25/20 2336                This chart was dictated using voice recognition software/Dragon. Despite best efforts to proofread, errors can occur which can change the meaning. Any change was purely unintentional.     Lannie Fields, PA-C 07/25/20 2346    Lavonia Drafts, MD 07/28/20 1028

## 2020-07-25 NOTE — Discharge Instructions (Signed)
Please take prednisone as directed. You can take Robaxin up to 3 times daily for muscle spasms.

## 2020-07-30 ENCOUNTER — Ambulatory Visit: Payer: Medicaid Other | Attending: Obstetrics and Gynecology | Admitting: Physical Therapy

## 2020-08-06 ENCOUNTER — Ambulatory Visit: Payer: Medicaid Other | Admitting: Physical Therapy

## 2020-08-06 ENCOUNTER — Other Ambulatory Visit: Payer: Self-pay

## 2020-08-13 ENCOUNTER — Other Ambulatory Visit: Payer: Self-pay

## 2020-08-13 ENCOUNTER — Encounter: Payer: Self-pay | Admitting: Physical Therapy

## 2020-08-13 ENCOUNTER — Ambulatory Visit: Payer: Medicaid Other | Attending: Obstetrics and Gynecology | Admitting: Physical Therapy

## 2020-08-13 DIAGNOSIS — R102 Pelvic and perineal pain: Secondary | ICD-10-CM | POA: Diagnosis present

## 2020-08-13 DIAGNOSIS — R278 Other lack of coordination: Secondary | ICD-10-CM | POA: Insufficient documentation

## 2020-08-13 DIAGNOSIS — M6281 Muscle weakness (generalized): Secondary | ICD-10-CM | POA: Diagnosis present

## 2020-08-13 NOTE — Therapy (Signed)
[facility] [address] [place], [place], [address] Phone: [phone]   Fax:  [phone]  Physical Therapy Evaluation  Patient Details  Name: Melanie Krause MRN: [id number] Date of Birth: 06-09-1970 Referring Provider (PT): Benjaman Kindler   Encounter Date: 08/13/2020   PT End of Session - 08/13/20 1133     Visit Number 1    Number of Visits 12    Date for PT Re-Evaluation 11/05/20    Authorization Type IE 08/13/2020    PT Start Time 0850    PT Stop Time 0935    PT Time Calculation (min) 45 min    Activity Tolerance Patient tolerated treatment well    Behavior During Therapy [facility] for tasks assessed/performed             History reviewed. No pertinent past medical history.  Past Surgical History:  Procedure Laterality Date   CESAREAN SECTION     HYSTEROSCOPY     LAPAROSCOPIC BILATERAL SALPINGECTOMY Bilateral 04/13/2015   Procedure: LAPAROSCOPIC BILATERAL SALPINGECTOMY;  Surgeon: Honor Loh Ward, MD;  Location: [facility] ORS;  Service: Gynecology;  Laterality: Bilateral;   LAPAROSCOPIC HYSTERECTOMY N/A 04/13/2015   Procedure: HYSTERECTOMY TOTAL LAPAROSCOPIC;  Surgeon: Honor Loh Ward, MD;  Location: [facility] ORS;  Service: Gynecology;  Laterality: N/A;   laparoscopy     TONSILLECTOMY     UMBILICAL HERNIA REPAIR  04/13/2015   Procedure: HERNIA REPAIR UMBILICAL ADULT;  Surgeon: Honor Loh Ward, MD;  Location: [facility] ORS;  Service: Gynecology;;    There were no vitals filed for this visit.        [facility] PT Assessment - 08/13/20 0001       Assessment   Medical Diagnosis chronic constipation, pelvic pain    Referring Provider (PT) Leafy Ro, Bethany    Hand Dominance Right      Balance Screen   Has the patient fallen in the past 6 months No              PELVIC HEALTH PHYSICAL THERAPY EVALUATION  SCREENING Red Flags: None Have you had any night sweats? Unexplained weight loss? Saddle anesthesia? Unexplained changes in  bowel or bladder habits?   SUBJECTIVE  Chief Complaint: Patient states that after hysterectomy in 2017, she has had been dealing with pain. Patient notes she did have a c-section in 2016. Patient reports about 2 years after the hysterectomy she was placed on medication for the pain. Patient got a second opinion and was told that the vaginal cuff was likely stuck to the bowel by a scar adhesion; surgery was presented as an option and patient was not interested in that as an intervention at this time. Patient reports that last week she was unable to sit straight in a chair because of pain.   Pertinent History:  Falls Negative.  Scoliosis Negative. Pulmonary disease/dysfunction Positive. Surgical history: Positive for see above.   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 Onset: years, but gradually gotten worse Triggers: coughing, urgency, driving. Amount: Min/Mod. Protective undergarments: Yes  Type: Poise light  Number used/day: only when out, 1 Nocturia: 2-3x/night (4 hours of sleep 1x) Frequency of urination: every 2 hours Pain with urination: Positive for sometimes; as bladder empties shooting pains Difficulty initiating urination: Negative Intermittent stream: Negative Frequent UTI: Negative.   Sexual activity/pain: Pain with intercourse: Positive.   Initial penetration: No  Deep thrusting: Yes External stimulation:  No Able to achieve orgasm: Yes; notes sometimes pain does inhibit.  Location of pain: deep in vagina; L  Current pain:  2/10  Max pain:  10+/10; penetration, unpredictable Least pain:  1-2/10 Pain quality: pain quality: sharp Radiating pain: No     Patient Goals:  Eliminate pain entirely if possible; at least get it to a minimum where it does not interrupt daily life.   OBJECTIVE  Mental Status Patient is oriented to person, place and time.  Recent memory is intact.  Remote memory is intact.  Attention span and concentration are intact.  Expressive speech is intact.  Patient's fund of knowledge is within normal limits for educational level.  POSTURE/OBSERVATIONS:  Lumbar lordosis: WNL Thoracic kyphosis: increased Patient sits with rounded spine and forward shoulders.   GAIT: Trendelenburg R: Positive L: Positive  RANGE OF MOTION: deferred 2/2 to time constraints   LEFT RIGHT  Lumbar forward flexion (65):      Lumbar extension (30):     Lumbar lateral flexion (25):     Thoracic and Lumbar rotation (30 degrees):       Hip Flexion (0-125):      Hip IR (0-45):     Hip ER (0-45):     Hip Abduction (0-40):     Hip extension (0-15):       STRENGTH: MMT deferred 2/2 to time constraints  RLE LLE  Hip Flexion    Hip Extension    Hip Abduction     Hip Adduction     Hip ER     Hip IR     Knee Extension    Knee Flexion    Dorsiflexion     Plantarflexion (seated)     ABDOMINAL: deferred 2/2 to time constraints Palpation: Diastasis: Scar mobility: Rib flare:  SPECIAL TESTS: deferred 2/2 to time constraints   PHYSICAL PERFORMANCE MEASURES: STS: WNL  EXTERNAL PELVIC EXAM: deferred 2/2 to time constraints Palpation: Breath coordination: Voluntary Contraction: present/absent Relaxation: full/delayed/non-relaxing Perineal movement with sustained IAP increase ("bear down"): descent/no change/elevation/excessive descent Perineal movement with rapid IAP increase  ("cough"): elevation/no change/descent  INTERNAL VAGINAL EXAM: deferred 2/2 to time constraints Introitus Appears:  Skin integrity:  Scar mobility: Strength (PERF):  Symmetry: Palpation: Prolapse:   INTERNAL RECTAL EXAM: not indicated Strength (PERF): Symmetry: Palpation: Prolapse:   OUTCOME MEASURES: FOTO (Urinary 60; PFDI Pain 46)   ASSESSMENT Patient is a 50 year old presenting to clinic with chief complaints of deep vaginal pain on L. Today's evaluation is suggestive of deficits in PFM coordination, PFM strength, scar mobility, intra-abdominal pressure management, pain, and posture as evidenced by increased thoracic kyphosis, 10+/10 worst pain in an unpredictable pattern, urinary leakage with stress and urge components, pain with bladder emptying. Patient's responses on FOTO outcome measures (Urinary 60, PFDI Pain 46) indicate significant functional limitations/disability/distress. Patient's progress may be limited due to persistence of symptoms; however, patient's motivation is advantageous. Patient will benefit from continued skilled therapeutic intervention to address deficits in PFM coordination, PFM strength, scar mobility, intra-abdominal pressure management, pain, and posture in order to increase function and improve overall QOL.  EDUCATION Patient educated on prognosis, POC,  and provided with HEP including: bladder diary. Patient articulated understanding and returned demonstration. Patient will benefit from further education in order to maximize compliance and understanding for long-term therapeutic gains.     Objective measurements completed on examination: See above findings.       PT Long Term Goals - 08/13/20 1200       PT LONG TERM GOAL #1   Title Patient will demonstrate independence with HEP in order to maximize therapeutic gains and improve carryover from physical therapy sessions to ADLs in the home and community.    Baseline IE: not demonstrated     Time 12    Period Weeks    Status New    Target Date 11/05/20      PT LONG TERM GOAL #2   Title Patient will decrease worst pain as reported on NPRS by at least 2 points to demonstrate clinically significant reduction in pain in order to restore/improve function and overall QOL.    Baseline IE: 10+/10    Time 12    Period Weeks    Status New    Target Date 11/05/20      PT LONG TERM GOAL #3   Title Patient will demonstrate improved function as evidenced by a score of 69 on FOTO Urinary measure for full participation in activities at home and in the community.    Baseline IE: 60    Time 12    Period Weeks    Status New    Target Date 11/05/20      PT LONG TERM GOAL #4   Title Patient will demonstrate improved function as evidenced by a score of <26 on FOTO PFDI Pain measure for full participation in activities at home and in the community.    Baseline IE: 8    Time 12    Period Weeks    Status New    Target Date 11/05/20                    Plan - 08/13/20 1134     Clinical Impression Statement Patient is a 50 year old presenting to clinic with chief complaints of deep vaginal pain on L. Today's evaluation is suggestive of deficits in PFM coordination, PFM strength, scar mobility, intra-abdominal pressure management, pain, and posture as evidenced by increased thoracic kyphosis, 10+/10 worst pain in an unpredictable pattern, urinary leakage with stress and urge components, pain with bladder emptying. Patient's responses on FOTO outcome measures (Urinary 60, PFDI Pain 46) indicate significant functional limitations/disability/distress. Patient's progress may be limited due to persistence of symptoms; however, patient's motivation is advantageous. Patient will benefit from continued skilled therapeutic intervention to address deficits in PFM coordination, PFM strength, scar mobility, intra-abdominal pressure management, pain, and posture in order to increase function and  improve overall QOL.    Personal Factors and Comorbidities Comorbidity 3+;Behavior Pattern;Past/Current Experience;Time since onset of injury/illness/exacerbation    Comorbidities OSA, hx of endometriosis, hx of hysterectomy, chronic B LBP, chronic r knee pain    Examination-Activity Limitations Transfers;Sleep;Continence;Squat;Lift;Bend;Caring for Others    Examination-Participation Restrictions Interpersonal Relationship;Cleaning;Laundry;Yard Work;Community Activity    Stability/Clinical Decision Making Evolving/Moderate complexity    Clinical Decision Making Moderate    Rehab Potential Good    PT Frequency 1x / week    PT Duration 12 weeks    PT Treatment/Interventions Cryotherapy;Electrical Stimulation;Moist Heat;Therapeutic exercise;Neuromuscular re-education;Patient/family education;Manual techniques;Scar mobilization;Dry needling;Taping;Spinal Manipulations;Joint Manipulations    PT Next Visit Plan physical assessment    PT Home Exercise  Plan bladder diary    Consulted and Agree with Plan of Care Patient             Patient will benefit from skilled therapeutic intervention in order to improve the following deficits and impairments:  Pain, Decreased coordination, Decreased strength, Increased fascial restricitons, Improper body mechanics, Decreased scar mobility, Increased muscle spasms  Visit Diagnosis: Pelvic pain  Other lack of coordination  Muscle weakness (generalized)     Problem List There are no problems to display for this patient.   Myles Gip PT, DPT [phone]  08/13/2020, 12:04 PM  [facility] [address] [place], [place], [address] Phone: [phone]   Fax:  [phone]  Name: Melanie Krause MRN: [id number] Date of Birth: 1970/11/25

## 2020-08-20 ENCOUNTER — Other Ambulatory Visit: Payer: Self-pay

## 2020-08-20 ENCOUNTER — Ambulatory Visit: Payer: Medicaid Other | Admitting: Physical Therapy

## 2020-08-20 ENCOUNTER — Encounter: Payer: Self-pay | Admitting: Physical Therapy

## 2020-08-20 DIAGNOSIS — R102 Pelvic and perineal pain: Secondary | ICD-10-CM

## 2020-08-20 DIAGNOSIS — M6281 Muscle weakness (generalized): Secondary | ICD-10-CM

## 2020-08-20 DIAGNOSIS — R278 Other lack of coordination: Secondary | ICD-10-CM

## 2020-08-20 NOTE — Therapy (Signed)
[facility] [address] [place], [place], [address] Phone: [phone]   Fax:  [phone]  Physical Therapy Treatment  Patient Details  Name: Melanie Krause MRN: [id number] Date of Birth: [birth year] Referring Provider (PT): Benjaman Kindler   Encounter Date: 08/20/2020   PT End of Session - 08/20/20 0956     Visit Number 2    Number of Visits 12    Date for PT Re-Evaluation 11/05/20    Authorization Type IE 08/13/2020    PT Start Time 0850    PT Stop Time 0935    PT Time Calculation (min) 45 min    Activity Tolerance Patient tolerated treatment well    Behavior During Therapy [facility] for tasks assessed/performed             History reviewed. No pertinent past medical history.  Past Surgical History:  Procedure Laterality Date   CESAREAN SECTION     HYSTEROSCOPY     LAPAROSCOPIC BILATERAL SALPINGECTOMY Bilateral 04/13/2015   Procedure: LAPAROSCOPIC BILATERAL SALPINGECTOMY;  Surgeon: Honor Loh Ward, MD;  Location: [facility];  Service: Gynecology;  Laterality: Bilateral;   LAPAROSCOPIC HYSTERECTOMY N/A 04/13/2015   Procedure: HYSTERECTOMY TOTAL LAPAROSCOPIC;  Surgeon: Honor Loh Ward, MD;  Location: [facility];  Service: Gynecology;  Laterality: N/A;   laparoscopy     TONSILLECTOMY     UMBILICAL HERNIA REPAIR  04/13/2015   Procedure: HERNIA REPAIR UMBILICAL ADULT;  Surgeon: Honor Loh Ward, MD;  Location: [facility] ORS;  Service: Gynecology;;    There were no vitals filed for this visit.   Subjective Assessment - 08/20/20 0851     Subjective Patient presents to clinic with bladder diary. Patient notes that she has been sweating a lot which makes an impact on her bathroom habits. She also has made a correlation between frequency and anxiousness about leakage; notes toilet mapping and "just in case" voiding regularly.    Currently in Pain? No/denies            TREATMENT  Pre-treatment assessment: L IC elevated, L ASIS  anterior RANGE OF MOTION:    LEFT RIGHT  Lumbar forward flexion (65):  WNL    Lumbar extension (30): WNL    Lumbar lateral flexion (25):  WNL WNL  Thoracic and Lumbar rotation (30 degrees):    WNL WNL    SENSATION: Grossly intact to light touch bilateral LEs as determined by testing dermatomes L2-S2 Proprioception and hot/cold testing deferred on this date  STRENGTH: MMT   RLE LLE  Hip Flexion 5 5  Hip Extension 5 5  Hip Abduction  5 5  Hip Adduction  5 5  Hip ER  5 5*  Hip IR  4* 4*  Knee Extension 5 5  Knee Flexion 5 5  Dorsiflexion  5 5  Plantarflexion (seated) 5 5   Neuromuscular Re-education: Patient educated extensively on typical bladder function, typical bladder habits and basics of urge suppression in order to better regulate bowel and bladder through behavioral changes.  Seated mermaid stretch, L, for improved posture and IAP management. VCs as needed to prevent compensations.  Patient educated throughout session on appropriate technique and form using multi-modal cueing, HEP, and activity modification. Patient articulated understanding and returned demonstration.  Patient Response to interventions: Denies any concerns.   ASSESSMENT Patient presents to clinic with excellent motivation to participate in therapy. Patient demonstrates deficits in PFM coordination, PFM strength, scar mobility, intra-abdominal pressure management, pain, and posture. Patient able to achieve  seated mermaid stretch with appropriate form during today's session and responded positively to educational interventions. Patient will benefit from continued skilled therapeutic intervention to address remaining deficits in PFM coordination, PFM strength, scar mobility, intra-abdominal pressure management, pain, and posture in order to increase function and improve overall QOL.      PT Long Term Goals - 08/13/20 1200       PT LONG TERM GOAL #1   Title Patient will demonstrate independence with HEP  in order to maximize therapeutic gains and improve carryover from physical therapy sessions to ADLs in the home and community.    Baseline IE: not demonstrated    Time 12    Period Weeks    Status New    Target Date 11/05/20      PT LONG TERM GOAL #2   Title Patient will decrease worst pain as reported on NPRS by at least 2 points to demonstrate clinically significant reduction in pain in order to restore/improve function and overall QOL.    Baseline IE: 10+/10    Time 12    Period Weeks    Status New    Target Date 11/05/20      PT LONG TERM GOAL #3   Title Patient will demonstrate improved function as evidenced by a score of 69 on FOTO Urinary measure for full participation in activities at home and in the community.    Baseline IE: 60    Time 12    Period Weeks    Status New    Target Date 11/05/20      PT LONG TERM GOAL #4   Title Patient will demonstrate improved function as evidenced by a score of <26 on FOTO PFDI Pain measure for full participation in activities at home and in the community.    Baseline IE: 66    Time 12    Period Weeks    Status New    Target Date 11/05/20                   Plan - 08/20/20 0955     Clinical Impression Statement Patient presents to clinic with excellent motivation to participate in therapy. Patient demonstrates deficits in PFM coordination, PFM strength, scar mobility, intra-abdominal pressure management, pain, and posture. Patient able to achieve seated mermaid stretch with appropriate form during today's session and responded positively to educational interventions. Patient will benefit from continued skilled therapeutic intervention to address remaining deficits in PFM coordination, PFM strength, scar mobility, intra-abdominal pressure management, pain, and posture in order to increase function and improve overall QOL.    Personal Factors and Comorbidities Comorbidity 3+;Behavior Pattern;Past/Current Experience;Time since onset  of injury/illness/exacerbation    Comorbidities OSA, hx of endometriosis, hx of hysterectomy, chronic B LBP, chronic r knee pain    Examination-Activity Limitations Transfers;Sleep;Continence;Squat;Lift;Bend;Caring for Others    Examination-Participation Restrictions Interpersonal Relationship;Cleaning;Laundry;Yard Work;Community Activity    Stability/Clinical Decision Making Evolving/Moderate complexity    Rehab Potential Good    PT Frequency 1x / week    PT Duration 12 weeks    PT Treatment/Interventions Cryotherapy;Electrical Stimulation;Moist Heat;Therapeutic exercise;Neuromuscular re-education;Patient/family education;Manual techniques;Scar mobilization;Dry needling;Taping;Spinal Manipulations;Joint Manipulations    PT Next Visit Plan abdominal wall, hips, external PFM    PT Home Exercise Plan mermaid stretch    Consulted and Agree with Plan of Care Patient             Patient will benefit from skilled therapeutic intervention in order to improve the following deficits and impairments:  Pain, Decreased coordination, Decreased strength, Increased fascial restricitons, Improper body mechanics, Decreased scar mobility, Increased muscle spasms  Visit Diagnosis: Pelvic pain  Muscle weakness (generalized)  Other lack of coordination     Problem List There are no problems to display for this patient.   Myles Gip PT, DPT [phone]  08/20/2020, 12:14 PM  [facility] [address] [place], [place], [address] Phone: [phone]   Fax:  [phone]  Name: Navdeep Volo MRN: [id number] Date of Birth: February 02, 1970

## 2020-08-27 ENCOUNTER — Other Ambulatory Visit: Payer: Self-pay

## 2020-08-27 ENCOUNTER — Ambulatory Visit: Payer: Medicaid Other | Admitting: Physical Therapy

## 2020-08-27 ENCOUNTER — Encounter: Payer: Self-pay | Admitting: Physical Therapy

## 2020-08-27 DIAGNOSIS — M6281 Muscle weakness (generalized): Secondary | ICD-10-CM

## 2020-08-27 DIAGNOSIS — R102 Pelvic and perineal pain: Secondary | ICD-10-CM

## 2020-08-27 DIAGNOSIS — R278 Other lack of coordination: Secondary | ICD-10-CM

## 2020-08-27 NOTE — Therapy (Signed)
[facility] [address] [place], [place], [address] Phone: [phone]   Fax:  [phone]  Physical Therapy Treatment  Patient Details  Name: Melanie Krause MRN: [id number] Date of Birth: 04/12/1970 Referring Provider (PT): Benjaman Kindler   Encounter Date: 08/27/2020   PT End of Session - 08/27/20 0855     Visit Number 3    Number of Visits 12    Date for PT Re-Evaluation 11/05/20    Authorization Type IE 08/13/2020    PT Start Time 0850    PT Stop Time 0930    PT Time Calculation (min) 40 min    Activity Tolerance Patient tolerated treatment well    Behavior During Therapy [facility] for tasks assessed/performed             History reviewed. No pertinent past medical history.  Past Surgical History:  Procedure Laterality Date   CESAREAN SECTION     HYSTEROSCOPY     LAPAROSCOPIC BILATERAL SALPINGECTOMY Bilateral 04/13/2015   Procedure: LAPAROSCOPIC BILATERAL SALPINGECTOMY;  Surgeon: Honor Loh Ward, MD;  Location: [facility]S;  Service: Gynecology;  Laterality: Bilateral;   LAPAROSCOPIC HYSTERECTOMY N/A 04/13/2015   Procedure: HYSTERECTOMY TOTAL LAPAROSCOPIC;  Surgeon: Honor Loh Ward, MD;  Location: [facility];  Service: Gynecology;  Laterality: N/A;   laparoscopy     TONSILLECTOMY     UMBILICAL HERNIA REPAIR  04/13/2015   Procedure: HERNIA REPAIR UMBILICAL ADULT;  Surgeon: Honor Loh Ward, MD;  Location: [facility]S;  Service: Gynecology;;    There were no vitals filed for this visit.   Subjective Assessment - 08/27/20 0850     Subjective Patient denis any significant changes. Patient did try some urge suppression strategies, but didn't feel they were helpful. She was unsure if the urge she was suppressing was the first or second signal.    Currently in Pain? No/denies             TREATMENT  Neuromuscular Re-education: EXTERNAL PELVIC EXAM: Patient educated on the purpose of the pelvic exam and articulated understanding;  patient consented to the exam verbally.  Breath coordination: Voluntary Contraction: present, palpable squeeze and lift Relaxation: full Perineal movement with sustained IAP increase ("bear down"): elevation Perineal movement with rapid IAP increase ("cough"): descent Supine hooklying diaphragmatic breathing with VCs and TCs for downregulation of the nervous system and improved management of IAP Supine hooklying, PFM lengthening with inhalation. VCs and TCs to decrease compensatory patterns and encourage optimal relaxation of the PFM. Sidelying, PFM lengthening with inhalation and self-palpation for improved proprioception. Patient education on nervous system downregulation strategies including: diaphragmatic breathing, decreasing sensory input (quiet music, slower driving, dimming lights).    Patient educated throughout session on appropriate technique and form using multi-modal cueing, HEP, and activity modification. Patient articulated understanding and returned demonstration.  Patient Response to interventions: Return in 1 week.  ASSESSMENT Patient presents to clinic with excellent motivation to participate in therapy. Patient demonstrates deficits in PFM coordination, PFM strength, scar mobility, intra-abdominal pressure management, pain, and posture. Patient able to practice PFM lengthening with coordinated breath during today's session and responded positively to educational interventions. Patient will benefit from continued skilled therapeutic intervention to address remaining deficits in PFM coordination, PFM strength, scar mobility, intra-abdominal pressure management, pain, and posture in order to increase function and improve overall QOL.    PT Long Term Goals - 08/13/20 1200       PT LONG TERM GOAL #1   Title Patient will  demonstrate independence with HEP in order to maximize therapeutic gains and improve carryover from physical therapy sessions to ADLs in the home and  community.    Baseline IE: not demonstrated    Time 12    Period Weeks    Status New    Target Date 11/05/20      PT LONG TERM GOAL #2   Title Patient will decrease worst pain as reported on NPRS by at least 2 points to demonstrate clinically significant reduction in pain in order to restore/improve function and overall QOL.    Baseline IE: 10+/10    Time 12    Period Weeks    Status New    Target Date 11/05/20      PT LONG TERM GOAL #3   Title Patient will demonstrate improved function as evidenced by a score of 69 on FOTO Urinary measure for full participation in activities at home and in the community.    Baseline IE: 60    Time 12    Period Weeks    Status New    Target Date 11/05/20      PT LONG TERM GOAL #4   Title Patient will demonstrate improved function as evidenced by a score of <26 on FOTO PFDI Pain measure for full participation in activities at home and in the community.    Baseline IE: 86    Time 12    Period Weeks    Status New    Target Date 11/05/20                   Plan - 08/27/20 1002     Clinical Impression Statement Patient presents to clinic with excellent motivation to participate in therapy. Patient demonstrates deficits in PFM coordination, PFM strength, scar mobility, intra-abdominal pressure management, pain, and posture. Patient able to practice PFM lengthening with coordinated breath during today's session and responded positively to educational interventions. Patient will benefit from continued skilled therapeutic intervention to address remaining deficits in PFM coordination, PFM strength, scar mobility, intra-abdominal pressure management, pain, and posture in order to increase function and improve overall QOL.    Personal Factors and Comorbidities Comorbidity 3+;Behavior Pattern;Past/Current Experience;Time since onset of injury/illness/exacerbation    Comorbidities OSA, hx of endometriosis, hx of hysterectomy, chronic B LBP, chronic r  knee pain    Examination-Activity Limitations Transfers;Sleep;Continence;Squat;Lift;Bend;Caring for Others    Examination-Participation Restrictions Interpersonal Relationship;Cleaning;Laundry;Yard Work;Community Activity    Stability/Clinical Decision Making Evolving/Moderate complexity    Rehab Potential Good    PT Frequency 1x / week    PT Duration 12 weeks    PT Treatment/Interventions Cryotherapy;Electrical Stimulation;Moist Heat;Therapeutic exercise;Neuromuscular re-education;Patient/family education;Manual techniques;Scar mobilization;Dry needling;Taping;Spinal Manipulations;Joint Manipulations    PT Next Visit Plan abdominal wall, hips    PT Home Exercise Plan mermaid stretch    Consulted and Agree with Plan of Care Patient             Patient will benefit from skilled therapeutic intervention in order to improve the following deficits and impairments:  Pain, Decreased coordination, Decreased strength, Increased fascial restricitons, Improper body mechanics, Decreased scar mobility, Increased muscle spasms  Visit Diagnosis: Pelvic pain  Muscle weakness (generalized)  Other lack of coordination     Problem List There are no problems to display for this patient.   Myles Gip PT, DPT [phone]  08/27/2020, 10:08 AM  [place] [facility] [address] [place], [place], [address] Phone: [phone]   Fax:  [phone]  Name: Melanie Krause MRN: [id number] Date of Birth: 1970-04-15

## 2020-09-03 ENCOUNTER — Other Ambulatory Visit: Payer: Self-pay

## 2020-09-03 ENCOUNTER — Encounter: Payer: Self-pay | Admitting: Physical Therapy

## 2020-09-03 ENCOUNTER — Ambulatory Visit: Payer: Medicaid Other | Admitting: Physical Therapy

## 2020-09-03 DIAGNOSIS — R102 Pelvic and perineal pain: Secondary | ICD-10-CM

## 2020-09-03 DIAGNOSIS — R278 Other lack of coordination: Secondary | ICD-10-CM

## 2020-09-03 DIAGNOSIS — M6281 Muscle weakness (generalized): Secondary | ICD-10-CM

## 2020-09-03 NOTE — Therapy (Signed)
[facility] [address] [place], [place], [address] Phone: [phone]   Fax:  [phone]  Physical Therapy Treatment  Patient Details  Name: Melanie Krause MRN: [id number] Date of Birth: December 28, 1970 Referring Provider (PT): Benjaman Kindler   Encounter Date: 09/03/2020   PT End of Session - 09/03/20 0903     Visit Number 4    Number of Visits 12    Date for PT Re-Evaluation 11/05/20    Authorization Type IE 08/13/2020    PT Start Time 0850    PT Stop Time 0930    PT Time Calculation (min) 40 min    Activity Tolerance Patient tolerated treatment well    Behavior During Therapy [facility] for tasks assessed/performed             History reviewed. No pertinent past medical history.  Past Surgical History:  Procedure Laterality Date   CESAREAN SECTION     HYSTEROSCOPY     LAPAROSCOPIC BILATERAL SALPINGECTOMY Bilateral 04/13/2015   Procedure: LAPAROSCOPIC BILATERAL SALPINGECTOMY;  Surgeon: Honor Loh Ward, MD;  Location: [facility];  Service: Gynecology;  Laterality: Bilateral;   LAPAROSCOPIC HYSTERECTOMY N/A 04/13/2015   Procedure: HYSTERECTOMY TOTAL LAPAROSCOPIC;  Surgeon: Honor Loh Ward, MD;  Location: [facility];  Service: Gynecology;  Laterality: N/A;   laparoscopy     TONSILLECTOMY     UMBILICAL HERNIA REPAIR  04/13/2015   Procedure: HERNIA REPAIR UMBILICAL ADULT;  Surgeon: Honor Loh Ward, MD;  Location: [facility];  Service: Gynecology;;    There were no vitals filed for this visit.   Subjective Assessment - 09/03/20 0857     Subjective Patient reports that she had a death in the family which has been quite stressful. Patient notes she has had increased Gi discomfort and urinary symptoms.    Currently in Pain? No/denies              TREATMENT  Neuromuscular Re-education: Supine hooklying diaphragmatic breathing with VCs and TCs for downregulation of the nervous system and improved management of IAP Supine Posterior  Pelvic Tilt with coordinated breath. VCs and TCs to decrease compensations as needed.  Supine Lower Trunk Rotation with coordinated breath. VCs and TCs to decrease compensations as needed.  Hooklying Single Knee to Chest Stretch with coordinated breath. VCs and TCs for to decrease compensations as needed. Supine Hamstring Stretch for decreased tension at the base of the pelvis and to downtrain muscles of the hip and LE Sidelying, thoracolumbar rotations (open-book) bilaterally with diaphragmatic breathing for improved diaphragmatic and rib cage excursion. VCs and TCs to prevent compensations.     Patient educated throughout session on appropriate technique and form using multi-modal cueing, HEP, and activity modification. Patient articulated understanding and returned demonstration.  Patient Response to interventions: Return in 1 week.  ASSESSMENT Patient presents to clinic with excellent motivation to participate in therapy. Patient demonstrates deficits in PFM coordination, PFM strength, scar mobility, intra-abdominal pressure management, pain, and posture. Patient with excellent form and response to gentle stretches for nervous system downtraining and decreased tensile forces of the hips and pelvis during today's session. Patient will benefit from continued skilled therapeutic intervention to address remaining deficits in PFM coordination, PFM strength, scar mobility, intra-abdominal pressure management, pain, and posture in order to increase function and improve overall QOL.    PT Long Term Goals - 08/13/20 1200       PT LONG TERM GOAL #1   Title Patient will demonstrate independence with HEP in  order to maximize therapeutic gains and improve carryover from physical therapy sessions to ADLs in the home and community.    Baseline IE: not demonstrated    Time 12    Period Weeks    Status New    Target Date 11/05/20      PT LONG TERM GOAL #2   Title Patient will decrease worst pain as  reported on NPRS by at least 2 points to demonstrate clinically significant reduction in pain in order to restore/improve function and overall QOL.    Baseline IE: 10+/10    Time 12    Period Weeks    Status New    Target Date 11/05/20      PT LONG TERM GOAL #3   Title Patient will demonstrate improved function as evidenced by a score of 69 on FOTO Urinary measure for full participation in activities at home and in the community.    Baseline IE: 60    Time 12    Period Weeks    Status New    Target Date 11/05/20      PT LONG TERM GOAL #4   Title Patient will demonstrate improved function as evidenced by a score of <26 on FOTO PFDI Pain measure for full participation in activities at home and in the community.    Baseline IE: 60    Time 12    Period Weeks    Status New    Target Date 11/05/20                   Plan - 09/03/20 0956     Clinical Impression Statement Patient presents to clinic with excellent motivation to participate in therapy. Patient demonstrates deficits in PFM coordination, PFM strength, scar mobility, intra-abdominal pressure management, pain, and posture. Patient with excellent form and response to gentle stretches for nervous system downtraining and decreased tensile forces of the hips and pelvis during today's session. Patient will benefit from continued skilled therapeutic intervention to address remaining deficits in PFM coordination, PFM strength, scar mobility, intra-abdominal pressure management, pain, and posture in order to increase function and improve overall QOL.    Personal Factors and Comorbidities Comorbidity 3+;Behavior Pattern;Past/Current Experience;Time since onset of injury/illness/exacerbation    Comorbidities OSA, hx of endometriosis, hx of hysterectomy, chronic B LBP, chronic r knee pain    Examination-Activity Limitations Transfers;Sleep;Continence;Squat;Lift;Bend;Caring for Others    Examination-Participation Restrictions  Interpersonal Relationship;Cleaning;Laundry;Yard Work;Community Activity    Stability/Clinical Decision Making Evolving/Moderate complexity    Rehab Potential Good    PT Frequency 1x / week    PT Duration 12 weeks    PT Treatment/Interventions Cryotherapy;Electrical Stimulation;Moist Heat;Therapeutic exercise;Neuromuscular re-education;Patient/family education;Manual techniques;Scar mobilization;Dry needling;Taping;Spinal Manipulations;Joint Manipulations    PT Next Visit Plan abdominal wall, hips    PT Home Exercise Plan [id number]    Consulted and Agree with Plan of Care Patient             Patient will benefit from skilled therapeutic intervention in order to improve the following deficits and impairments:  Pain, Decreased coordination, Decreased strength, Increased fascial restricitons, Improper body mechanics, Decreased scar mobility, Increased muscle spasms  Visit Diagnosis: Pelvic pain  Muscle weakness (generalized)  Other lack of coordination     Problem List There are no problems to display for this patient.   Myles Gip PT, DPT [phone]  09/03/2020, 11:21 AM  [place] [facility] [address] [place], [place], [address] Phone: [phone]   Fax:  [phone]  Name: Thressa Bayliff MRN: [id number] Date of Birth: 01/06/1971

## 2020-09-10 ENCOUNTER — Encounter: Payer: Self-pay | Admitting: Physical Therapy

## 2020-09-10 ENCOUNTER — Ambulatory Visit: Payer: Medicaid Other | Attending: Obstetrics and Gynecology | Admitting: Physical Therapy

## 2020-09-10 ENCOUNTER — Other Ambulatory Visit: Payer: Self-pay

## 2020-09-10 DIAGNOSIS — M6281 Muscle weakness (generalized): Secondary | ICD-10-CM | POA: Diagnosis present

## 2020-09-10 DIAGNOSIS — R102 Pelvic and perineal pain: Secondary | ICD-10-CM | POA: Diagnosis not present

## 2020-09-10 DIAGNOSIS — R278 Other lack of coordination: Secondary | ICD-10-CM | POA: Insufficient documentation

## 2020-09-10 NOTE — Therapy (Signed)
[facility] [address] [place], [place], [address] Phone: [phone]   Fax:  [phone]  Physical Therapy Treatment  Patient Details  Name: Melanie Krause MRN: [id number] Date of Birth: [birth year] Referring Provider (PT): Benjaman Kindler   Encounter Date: 09/10/2020   PT End of Session - 09/10/20 0853     Visit Number 5    Number of Visits 12    Date for PT Re-Evaluation 11/05/20    Authorization Type IE 08/13/2020    PT Start Time 0850    PT Stop Time 0930    PT Time Calculation (min) 40 min    Activity Tolerance Patient tolerated treatment well    Behavior During Therapy [facility] for tasks assessed/performed             History reviewed. No pertinent past medical history.  Past Surgical History:  Procedure Laterality Date   CESAREAN SECTION     HYSTEROSCOPY     LAPAROSCOPIC BILATERAL SALPINGECTOMY Bilateral 04/13/2015   Procedure: LAPAROSCOPIC BILATERAL SALPINGECTOMY;  Surgeon: Honor Loh Ward, MD;  Location: [facility];  Service: Gynecology;  Laterality: Bilateral;   LAPAROSCOPIC HYSTERECTOMY N/A 04/13/2015   Procedure: HYSTERECTOMY TOTAL LAPAROSCOPIC;  Surgeon: Honor Loh Ward, MD;  Location: [facility];  Service: Gynecology;  Laterality: N/A;   laparoscopy     TONSILLECTOMY     UMBILICAL HERNIA REPAIR  04/13/2015   Procedure: HERNIA REPAIR UMBILICAL ADULT;  Surgeon: Honor Loh Ward, MD;  Location: [facility];  Service: Gynecology;;    There were no vitals filed for this visit.   Subjective Assessment - 09/10/20 0852     Subjective Patient notes that some of her urinary symptoms have improved as she has had a decrease in stress from last visit. Denies any other significant changes.    Currently in Pain? No/denies              TREATMENT  Pre-treatment assessment: TTP of L SIJ and glute med mm, TTP of R sacral border/sacrococcygeal ligaments at roughly S3-4 levels. L hip ER and IR 5/5, R hip ER 4/5, R hip IR  3+/5  Manual Therapy: STM and TPR performed to B gluteal mm to allow for decreased tension and pain and improved posture and function L innominate mobilizations with movement for decreased spasm and improved mobility, grade II/III  R sacral border mobilizations with and without movement for for improved pain and mobility  Neuromuscular Re-education: Supine figure 4 stretch for improved lumbopelvic mobility and pain Supine hip IR stretch for improved lumbopelvic mobility and pain   Patient educated throughout session on appropriate technique and form using multi-modal cueing, HEP, and activity modification. Patient articulated understanding and returned demonstration.  Patient Response to interventions: Return in 1 week.  ASSESSMENT Patient presents to clinic with excellent motivation to participate in therapy. Patient demonstrates deficits in PFM coordination, PFM strength, scar mobility, intra-abdominal pressure management, pain, and posture. Patient with considerable tenderness at R sacral border with deep palpation and mobilization during today's session, but had decreased pain response with increased/continued manual techniques. Patient will benefit from continued skilled therapeutic intervention to address remaining deficits in PFM coordination, PFM strength, scar mobility, intra-abdominal pressure management, pain, and posture in order to increase function and improve overall QOL.   PT Long Term Goals - 08/13/20 1200       PT LONG TERM GOAL #1   Title Patient will demonstrate independence with HEP in order to maximize therapeutic gains and improve carryover  from physical therapy sessions to ADLs in the home and community.    Baseline IE: not demonstrated    Time 12    Period Weeks    Status New    Target Date 11/05/20      PT LONG TERM GOAL #2   Title Patient will decrease worst pain as reported on NPRS by at least 2 points to demonstrate clinically significant reduction in pain  in order to restore/improve function and overall QOL.    Baseline IE: 10+/10    Time 12    Period Weeks    Status New    Target Date 11/05/20      PT LONG TERM GOAL #3   Title Patient will demonstrate improved function as evidenced by a score of 69 on FOTO Urinary measure for full participation in activities at home and in the community.    Baseline IE: 60    Time 12    Period Weeks    Status New    Target Date 11/05/20      PT LONG TERM GOAL #4   Title Patient will demonstrate improved function as evidenced by a score of <26 on FOTO PFDI Pain measure for full participation in activities at home and in the community.    Baseline IE: 62    Time 12    Period Weeks    Status New    Target Date 11/05/20                   Plan - 09/10/20 0853     Clinical Impression Statement Patient presents to clinic with excellent motivation to participate in therapy. Patient demonstrates deficits in PFM coordination, PFM strength, scar mobility, intra-abdominal pressure management, pain, and posture. Patient with considerable tenderness at R sacral border with deep palpation and mobilization during today's session, but had decreased pain response with increased/continued manual techniques. Patient will benefit from continued skilled therapeutic intervention to address remaining deficits in PFM coordination, PFM strength, scar mobility, intra-abdominal pressure management, pain, and posture in order to increase function and improve overall QOL.    Personal Factors and Comorbidities Comorbidity 3+;Behavior Pattern;Past/Current Experience;Time since onset of injury/illness/exacerbation    Comorbidities OSA, hx of endometriosis, hx of hysterectomy, chronic B LBP, chronic r knee pain    Examination-Activity Limitations Transfers;Sleep;Continence;Squat;Lift;Bend;Caring for Others    Examination-Participation Restrictions Interpersonal Relationship;Cleaning;Laundry;Yard Work;Community Activity     Stability/Clinical Decision Making Evolving/Moderate complexity    Rehab Potential Good    PT Frequency 1x / week    PT Duration 12 weeks    PT Treatment/Interventions Cryotherapy;Electrical Stimulation;Moist Heat;Therapeutic exercise;Neuromuscular re-education;Patient/family education;Manual techniques;Scar mobilization;Dry needling;Taping;Spinal Manipulations;Joint Manipulations    PT Next Visit Plan abdominal wall, hips    PT Home Exercise Plan [id number]    Consulted and Agree with Plan of Care Patient             Patient will benefit from skilled therapeutic intervention in order to improve the following deficits and impairments:  Pain, Decreased coordination, Decreased strength, Increased fascial restricitons, Improper body mechanics, Decreased scar mobility, Increased muscle spasms  Visit Diagnosis: Pelvic pain  Muscle weakness (generalized)  Other lack of coordination     Problem List There are no problems to display for this patient.   Myles Gip PT, [place] #[id number]  09/10/2020, 11:36 AM  [facility] [address] [place], [place], [address] Phone: [phone]   Fax:  [phone]  Name: Melanie Krause MRN: [id number] Date  of Birth: Sep 07, 1970

## 2020-09-17 ENCOUNTER — Encounter: Payer: Self-pay | Admitting: Physical Therapy

## 2020-09-17 ENCOUNTER — Other Ambulatory Visit: Payer: Self-pay

## 2020-09-17 ENCOUNTER — Ambulatory Visit: Payer: Medicaid Other | Admitting: Physical Therapy

## 2020-09-17 DIAGNOSIS — R102 Pelvic and perineal pain: Secondary | ICD-10-CM

## 2020-09-17 DIAGNOSIS — R278 Other lack of coordination: Secondary | ICD-10-CM

## 2020-09-17 DIAGNOSIS — M6281 Muscle weakness (generalized): Secondary | ICD-10-CM

## 2020-09-17 NOTE — Therapy (Signed)
[place] [address] [place], [place], [address] Phone: [phone]   Fax:  [phone]  Physical Therapy Treatment  Patient Details  Name: Melanie Krause MRN: [id number] Date of Birth: 1970/02/14 Referring Provider (PT): Benjaman Kindler   Encounter Date: 09/17/2020   PT End of Session - 09/17/20 0902     Visit Number 6    Number of Visits 12    Date for PT Re-Evaluation 11/05/20    Authorization Type IE 08/13/2020    PT Start Time 0847    PT Stop Time 0928    PT Time Calculation (min) 41 min    Activity Tolerance Patient tolerated treatment well    Behavior During Therapy [facility] for tasks assessed/performed             History reviewed. No pertinent past medical history.  Past Surgical History:  Procedure Laterality Date   CESAREAN SECTION     HYSTEROSCOPY     LAPAROSCOPIC BILATERAL SALPINGECTOMY Bilateral 04/13/2015   Procedure: LAPAROSCOPIC BILATERAL SALPINGECTOMY;  Surgeon: Honor Loh Ward, MD;  Location: [facility]S;  Service: Gynecology;  Laterality: Bilateral;   LAPAROSCOPIC HYSTERECTOMY N/A 04/13/2015   Procedure: HYSTERECTOMY TOTAL LAPAROSCOPIC;  Surgeon: Honor Loh Ward, MD;  Location: [facility];  Service: Gynecology;  Laterality: N/A;   laparoscopy     TONSILLECTOMY     UMBILICAL HERNIA REPAIR  04/13/2015   Procedure: HERNIA REPAIR UMBILICAL ADULT;  Surgeon: Honor Loh Ward, MD;  Location: [facility] ORS;  Service: Gynecology;;    There were no vitals filed for this visit.   Subjective Assessment - 09/17/20 0849     Subjective Patient states that she has been doing stretches but her back has been aggravated from them. She reports some radiation down the leg to upper thigh. Patient has also been doing a lot of heavy yard work (Saturday) which was not painful, but the past 2 days she has felt more fatigued and achey (last night 5-6/10). Urinary symptoms continue to improve; she notes being able to make it to the bathroom  without issue/complaint. Patient has even been able ot leave the house without protective undergarment.    Currently in Pain? Yes    Pain Score 2     Pain Location Back    Pain Orientation Left;Lower    Pain Descriptors / Indicators Aching             TREATMENT  Pre-treatment assessment: TTP of L SIJ and glute med mm  Manual Therapy: STM and TPR performed to B gluteal mm to allow for decreased tension and pain and improved posture and function L innominate mobilizations with movement for decreased spasm and improved mobility, grade II/III  L sacral border mobilizations with and without movement for for improved pain and mobility  Neuromuscular Re-education: Sidelying clamshell for improved force closure of SIJ and pelvic posture Reviewed home stretches including figure 4 and hip IR stretch for improved and continued pain management.   Patient educated throughout session on appropriate technique and form using multi-modal cueing, HEP, and activity modification. Patient articulated understanding and returned demonstration.  Patient Response to interventions: Notes feels good.  ASSESSMENT Patient presents to clinic with excellent motivation to participate in therapy. Patient demonstrates deficits in PFM coordination, PFM strength, scar mobility, intra-abdominal pressure management, pain, and posture. Patient with reduction in tenderness at L glute med with manual during today's session and responded positively to active interventions. Patient will benefit from continued skilled therapeutic intervention to  address remaining deficits in PFM coordination, PFM strength, scar mobility, intra-abdominal pressure management, pain, and posture in order to increase function and improve overall QOL.   PT Long Term Goals - 08/13/20 1200       PT LONG TERM GOAL #1   Title Patient will demonstrate independence with HEP in order to maximize therapeutic gains and improve carryover from physical  therapy sessions to ADLs in the home and community.    Baseline IE: not demonstrated    Time 12    Period Weeks    Status New    Target Date 11/05/20      PT LONG TERM GOAL #2   Title Patient will decrease worst pain as reported on NPRS by at least 2 points to demonstrate clinically significant reduction in pain in order to restore/improve function and overall QOL.    Baseline IE: 10+/10    Time 12    Period Weeks    Status New    Target Date 11/05/20      PT LONG TERM GOAL #3   Title Patient will demonstrate improved function as evidenced by a score of 69 on FOTO Urinary measure for full participation in activities at home and in the community.    Baseline IE: 60    Time 12    Period Weeks    Status New    Target Date 11/05/20      PT LONG TERM GOAL #4   Title Patient will demonstrate improved function as evidenced by a score of <26 on FOTO PFDI Pain measure for full participation in activities at home and in the community.    Baseline IE: 2    Time 12    Period Weeks    Status New    Target Date 11/05/20                   Plan - 09/17/20 0929     Clinical Impression Statement Patient presents to clinic with excellent motivation to participate in therapy. Patient demonstrates deficits in PFM coordination, PFM strength, scar mobility, intra-abdominal pressure management, pain, and posture. Patient with reduction in tenderness at L glute med with manual during today's session and responded positively to active interventions. Patient will benefit from continued skilled therapeutic intervention to address remaining deficits in PFM coordination, PFM strength, scar mobility, intra-abdominal pressure management, pain, and posture in order to increase function and improve overall QOL.    Personal Factors and Comorbidities Comorbidity 3+;Behavior Pattern;Past/Current Experience;Time since onset of injury/illness/exacerbation    Comorbidities OSA, hx of endometriosis, hx of  hysterectomy, chronic B LBP, chronic r knee pain    Examination-Activity Limitations Transfers;Sleep;Continence;Squat;Lift;Bend;Caring for Others    Examination-Participation Restrictions Interpersonal Relationship;Cleaning;Laundry;Yard Work;Community Activity    Stability/Clinical Decision Making Evolving/Moderate complexity    Rehab Potential Good    PT Frequency 1x / week    PT Duration 12 weeks    PT Treatment/Interventions Cryotherapy;Electrical Stimulation;Moist Heat;Therapeutic exercise;Neuromuscular re-education;Patient/family education;Manual techniques;Scar mobilization;Dry needling;Taping;Spinal Manipulations;Joint Manipulations    PT Next Visit Plan abdominal wall, hips    PT Home Exercise Plan [id number]    Consulted and Agree with Plan of Care Patient             Patient will benefit from skilled therapeutic intervention in order to improve the following deficits and impairments:  Pain, Decreased coordination, Decreased strength, Increased fascial restricitons, Improper body mechanics, Decreased scar mobility, Increased muscle spasms  Visit Diagnosis: Pelvic pain  Muscle weakness (generalized)  Other lack  of coordination     Problem List There are no problems to display for this patient.   Myles Gip PT, DPT [phone]  09/17/2020, 11:45 AM  [place] [facility] [address] [place], [place], [address] Phone: [phone]   Fax:  [phone]  Name: Melanie Krause MRN: [id number] Date of Birth: 08-30-1970

## 2020-10-01 ENCOUNTER — Encounter: Payer: Self-pay | Admitting: Physical Therapy

## 2020-10-01 ENCOUNTER — Other Ambulatory Visit: Payer: Self-pay

## 2020-10-01 ENCOUNTER — Ambulatory Visit: Payer: Medicaid Other | Admitting: Physical Therapy

## 2020-10-01 DIAGNOSIS — R278 Other lack of coordination: Secondary | ICD-10-CM

## 2020-10-01 DIAGNOSIS — M6281 Muscle weakness (generalized): Secondary | ICD-10-CM

## 2020-10-01 DIAGNOSIS — R102 Pelvic and perineal pain: Secondary | ICD-10-CM | POA: Diagnosis not present

## 2020-10-01 NOTE — Therapy (Signed)
[place] [facility] [address] [place], [place], [address] Phone: [phone]   Fax:  [phone]  Physical Therapy Treatment  Patient Details  Name: Melanie Krause MRN: [id number] Date of Birth: [birth year] Referring Provider (PT): Benjaman Kindler   Encounter Date: 10/01/2020   PT End of Session - 10/01/20 0857     Visit Number 7    Number of Visits 12    Date for PT Re-Evaluation 11/05/20    Authorization Type IE 08/13/2020    PT Start Time 0848    PT Stop Time 0930    PT Time Calculation (min) 42 min    Activity Tolerance Patient tolerated treatment well    Behavior During Therapy [facility] for tasks assessed/performed             History reviewed. No pertinent past medical history.  Past Surgical History:  Procedure Laterality Date   CESAREAN SECTION     HYSTEROSCOPY     LAPAROSCOPIC BILATERAL SALPINGECTOMY Bilateral 04/13/2015   Procedure: LAPAROSCOPIC BILATERAL SALPINGECTOMY;  Surgeon: Honor Loh Ward, MD;  Location: [facility];  Service: Gynecology;  Laterality: Bilateral;   LAPAROSCOPIC HYSTERECTOMY N/A 04/13/2015   Procedure: HYSTERECTOMY TOTAL LAPAROSCOPIC;  Surgeon: Honor Loh Ward, MD;  Location: [facility];  Service: Gynecology;  Laterality: N/A;   laparoscopy     TONSILLECTOMY     UMBILICAL HERNIA REPAIR  04/13/2015   Procedure: HERNIA REPAIR UMBILICAL ADULT;  Surgeon: Honor Loh Ward, MD;  Location: [facility];  Service: Gynecology;;    There were no vitals filed for this visit.   Subjective Assessment - 10/01/20 0851     Subjective Patient notes that her back is stiff right now. Patient has not been doing exercises for the past couple days as she has needed to prioritize packing and cleaning prior to leaving for vacation. Patient notes that she felt good after last session and did not have any soreness or pain from manual interventions.    Currently in Pain? Yes    Pain Score 4     Pain Location Back    Pain Orientation  Lower              TREATMENT  Pre-treatment assessment: TTP of L SIJ, L glute med mm, L lumbar paraspinals  Manual Therapy: STM and TPR performed to B gluteal mm to allow for decreased tension and pain and improved posture and function L innominate mobilizations with movement for decreased spasm and improved mobility, grade II/III  L sacral border mobilizations with and without movement for for improved pain and mobility L LAD in sidelying for improved pelvic posture and decreased pain   Patient educated throughout session on appropriate technique and form using multi-modal cueing, HEP, and activity modification. Patient articulated understanding and returned demonstration.  Patient Response to interventions: 0/10 pain  ASSESSMENT Patient presents to clinic with excellent motivation to participate in therapy. Patient demonstrates deficits in PFM coordination, PFM strength, scar mobility, intra-abdominal pressure management, pain, and posture. Patient had 4 point reduction in pain (NPRS) with manual interventions during today's session and continues to respond positively to HEP. Patient will benefit from continued skilled therapeutic intervention to address remaining deficits in PFM coordination, PFM strength, scar mobility, intra-abdominal pressure management, pain, and posture in order to increase function and improve overall QOL.    PT Long Term Goals - 08/13/20 1200       PT LONG TERM GOAL #1   Title Patient will demonstrate independence with HEP  in order to maximize therapeutic gains and improve carryover from physical therapy sessions to ADLs in the home and community.    Baseline IE: not demonstrated    Time 12    Period Weeks    Status New    Target Date 11/05/20      PT LONG TERM GOAL #2   Title Patient will decrease worst pain as reported on NPRS by at least 2 points to demonstrate clinically significant reduction in pain in order to restore/improve function and  overall QOL.    Baseline IE: 10+/10    Time 12    Period Weeks    Status New    Target Date 11/05/20      PT LONG TERM GOAL #3   Title Patient will demonstrate improved function as evidenced by a score of 69 on FOTO Urinary measure for full participation in activities at home and in the community.    Baseline IE: 60    Time 12    Period Weeks    Status New    Target Date 11/05/20      PT LONG TERM GOAL #4   Title Patient will demonstrate improved function as evidenced by a score of <26 on FOTO PFDI Pain measure for full participation in activities at home and in the community.    Baseline IE: 47    Time 12    Period Weeks    Status New    Target Date 11/05/20                   Plan - 10/01/20 0857     Clinical Impression Statement Patient presents to clinic with excellent motivation to participate in therapy. Patient demonstrates deficits in PFM coordination, PFM strength, scar mobility, intra-abdominal pressure management, pain, and posture. Patient had 4 point reduction in pain (NPRS) with manual interventions during today's session and continues to respond positively to HEP. Patient will benefit from continued skilled therapeutic intervention to address remaining deficits in PFM coordination, PFM strength, scar mobility, intra-abdominal pressure management, pain, and posture in order to increase function and improve overall QOL.    Personal Factors and Comorbidities Comorbidity 3+;Behavior Pattern;Past/Current Experience;Time since onset of injury/illness/exacerbation    Comorbidities OSA, hx of endometriosis, hx of hysterectomy, chronic B LBP, chronic r knee pain    Examination-Activity Limitations Transfers;Sleep;Continence;Squat;Lift;Bend;Caring for Others    Examination-Participation Restrictions Interpersonal Relationship;Cleaning;Laundry;Yard Work;Community Activity    Stability/Clinical Decision Making Evolving/Moderate complexity    Rehab Potential Good    PT  Frequency 1x / week    PT Duration 12 weeks    PT Treatment/Interventions Cryotherapy;Electrical Stimulation;Moist Heat;Therapeutic exercise;Neuromuscular re-education;Patient/family education;Manual techniques;Scar mobilization;Dry needling;Taping;Spinal Manipulations;Joint Manipulations    PT Next Visit Plan abdominal wall, hips    PT Home Exercise Plan [email or web address]    Consulted and Agree with Plan of Care Patient             Patient will benefit from skilled therapeutic intervention in order to improve the following deficits and impairments:  Pain, Decreased coordination, Decreased strength, Increased fascial restricitons, Improper body mechanics, Decreased scar mobility, Increased muscle spasms  Visit Diagnosis: Pelvic pain  Muscle weakness (generalized)  Other lack of coordination     Problem List There are no problems to display for this patient.   Myles Gip PT, DPT [phone]  10/01/2020, 12:00 PM   [facility] [address] [place], [place], [address] Phone: [phone]   Fax:  [phone]  Name:  Melanie Krause MRN: [id number] Date of Birth: November 11, 1970

## 2020-10-08 ENCOUNTER — Other Ambulatory Visit: Payer: Self-pay

## 2020-10-08 ENCOUNTER — Encounter: Payer: Self-pay | Admitting: Physical Therapy

## 2020-10-08 ENCOUNTER — Ambulatory Visit: Payer: Medicaid Other | Admitting: Physical Therapy

## 2020-10-08 DIAGNOSIS — R102 Pelvic and perineal pain: Secondary | ICD-10-CM | POA: Diagnosis not present

## 2020-10-08 DIAGNOSIS — R278 Other lack of coordination: Secondary | ICD-10-CM

## 2020-10-08 DIAGNOSIS — M6281 Muscle weakness (generalized): Secondary | ICD-10-CM

## 2020-10-08 NOTE — Therapy (Signed)
[facility] [address] [place], [place], [address] Phone: [phone]   Fax:  [phone]  Physical Therapy Treatment  Patient Details  Name: Melanie Krause MRN: [id number] Date of Birth: Sep 01, 1970 Referring Provider (PT): Benjaman Kindler   Encounter Date: 10/08/2020   PT End of Session - 10/08/20 0906     Visit Number 8    Number of Visits 12    Date for PT Re-Evaluation 11/05/20    Authorization Type IE 08/13/2020    PT Start Time 0857    PT Stop Time 0940    PT Time Calculation (min) 43 min    Activity Tolerance Patient tolerated treatment well    Behavior During Therapy [facility] for tasks assessed/performed             History reviewed. No pertinent past medical history.  Past Surgical History:  Procedure Laterality Date   CESAREAN SECTION     HYSTEROSCOPY     LAPAROSCOPIC BILATERAL SALPINGECTOMY Bilateral 04/13/2015   Procedure: LAPAROSCOPIC BILATERAL SALPINGECTOMY;  Surgeon: Honor Loh Ward, MD;  Location: [facility];  Service: Gynecology;  Laterality: Bilateral;   LAPAROSCOPIC HYSTERECTOMY N/A 04/13/2015   Procedure: HYSTERECTOMY TOTAL LAPAROSCOPIC;  Surgeon: Honor Loh Ward, MD;  Location: [facility];  Service: Gynecology;  Laterality: N/A;   laparoscopy     TONSILLECTOMY     UMBILICAL HERNIA REPAIR  04/13/2015   Procedure: HERNIA REPAIR UMBILICAL ADULT;  Surgeon: Honor Loh Ward, MD;  Location: [facility];  Service: Gynecology;;    There were no vitals filed for this visit.   Subjective Assessment - 10/08/20 0900     Subjective Patient presents to clinic 15 minutes late 2/2 to inclement weather and traffic. Patient notes that she did have a nice trip to [place] with family. Patient reports a lot of walking on the trip but no adverse response. Patient did note that after last session she could tell we had worked the muscles.    Currently in Pain? No/denies             TREATMENT  Neuromuscular  Re-education: Quadruped cat-cow for improved spinal mobility and postural awareness Quadruped child's pose rocking for improved spinal mobility and postural awareness Countertop cat-cow for improved spinal mobility and postural awareness Countertop and standing TrA activation with coordinated breath for improved postural control Countertop TrA activation with UE reach and coordinated breath for improved postural control Patient education on strategies for progression of postural stabilization exercises as well as brief education on anatomy of muscular area of focus.   Patient educated throughout session on appropriate technique and form using multi-modal cueing, HEP, and activity modification. Patient articulated understanding and returned demonstration.  Patient Response to interventions:  Notes feeling comfortable and confident with new exercises; eager to shift focus in sessions toward movement based interventions.  ASSESSMENT Patient presents to clinic with excellent motivation to participate in therapy. Patient demonstrates deficits in PFM coordination, PFM strength, scar mobility, intra-abdominal pressure management, pain, and posture. Patient able to achieve excellent coordination of TrA m in isolated and synergistic patterns during today's session and responded positively to active interventions. Patient will benefit from continued skilled therapeutic intervention to address remaining deficits in PFM coordination, PFM strength, scar mobility, intra-abdominal pressure management, pain, and posture in order to increase function and improve overall QOL.     PT Long Term Goals - 08/13/20 1200       PT LONG TERM GOAL #1   Title Patient will  demonstrate independence with HEP in order to maximize therapeutic gains and improve carryover from physical therapy sessions to ADLs in the home and community.    Baseline IE: not demonstrated    Time 12    Period Weeks    Status New    Target Date  11/05/20      PT LONG TERM GOAL #2   Title Patient will decrease worst pain as reported on NPRS by at least 2 points to demonstrate clinically significant reduction in pain in order to restore/improve function and overall QOL.    Baseline IE: 10+/10    Time 12    Period Weeks    Status New    Target Date 11/05/20      PT LONG TERM GOAL #3   Title Patient will demonstrate improved function as evidenced by a score of 69 on FOTO Urinary measure for full participation in activities at home and in the community.    Baseline IE: 60    Time 12    Period Weeks    Status New    Target Date 11/05/20      PT LONG TERM GOAL #4   Title Patient will demonstrate improved function as evidenced by a score of <26 on FOTO PFDI Pain measure for full participation in activities at home and in the community.    Baseline IE: 56    Time 12    Period Weeks    Status New    Target Date 11/05/20                   Plan - 10/08/20 0906     Clinical Impression Statement Patient presents to clinic with excellent motivation to participate in therapy. Patient demonstrates deficits in PFM coordination, PFM strength, scar mobility, intra-abdominal pressure management, pain, and posture. Patient able to achieve excellent coordination of TrA m in isolated and synergistic patterns during today's session and responded positively to active interventions. Patient will benefit from continued skilled therapeutic intervention to address remaining deficits in PFM coordination, PFM strength, scar mobility, intra-abdominal pressure management, pain, and posture in order to increase function and improve overall QOL.    Personal Factors and Comorbidities Comorbidity 3+;Behavior Pattern;Past/Current Experience;Time since onset of injury/illness/exacerbation    Comorbidities OSA, hx of endometriosis, hx of hysterectomy, chronic B LBP, chronic r knee pain    Examination-Activity Limitations  Transfers;Sleep;Continence;Squat;Lift;Bend;Caring for Others    Examination-Participation Restrictions Interpersonal Relationship;Cleaning;Laundry;Yard Work;Community Activity    Stability/Clinical Decision Making Evolving/Moderate complexity    Rehab Potential Good    PT Frequency 1x / week    PT Duration 12 weeks    PT Treatment/Interventions Cryotherapy;Electrical Stimulation;Moist Heat;Therapeutic exercise;Neuromuscular re-education;Patient/family education;Manual techniques;Scar mobilization;Dry needling;Taping;Spinal Manipulations;Joint Manipulations    PT Next Visit Plan abdominal wall, hips    PT Home Exercise Plan [email or web address]    Consulted and Agree with Plan of Care Patient             Patient will benefit from skilled therapeutic intervention in order to improve the following deficits and impairments:  Pain, Decreased coordination, Decreased strength, Increased fascial restricitons, Improper body mechanics, Decreased scar mobility, Increased muscle spasms  Visit Diagnosis: Pelvic pain  Other lack of coordination  Muscle weakness (generalized)     Problem List There are no problems to display for this patient.   Myles Gip PT, DPT [phone]  10/08/2020, 10:18 AM  [place] [facility] [address] [place], [place], [address] Phone: [phone]  Fax:  [phone]  Name: Melanie Krause MRN: [id number] Date of Birth: 07-01-1970

## 2020-10-15 ENCOUNTER — Encounter: Payer: Self-pay | Admitting: Physical Therapy

## 2020-10-15 ENCOUNTER — Other Ambulatory Visit: Payer: Self-pay

## 2020-10-15 ENCOUNTER — Ambulatory Visit: Payer: Medicaid Other | Attending: Obstetrics and Gynecology | Admitting: Physical Therapy

## 2020-10-15 DIAGNOSIS — R102 Pelvic and perineal pain: Secondary | ICD-10-CM | POA: Insufficient documentation

## 2020-10-15 DIAGNOSIS — M6281 Muscle weakness (generalized): Secondary | ICD-10-CM | POA: Diagnosis present

## 2020-10-15 DIAGNOSIS — R278 Other lack of coordination: Secondary | ICD-10-CM | POA: Insufficient documentation

## 2020-10-15 NOTE — Therapy (Signed)
[facility] [address] [place], [place], [address] Phone: [phone]   Fax:  [phone]  Physical Therapy Treatment  Patient Details  Name: Melanie Krause MRN: [id number] Date of Birth: [birth year] Referring Provider (PT): Benjaman Kindler   Encounter Date: 10/15/2020   PT End of Session - 10/15/20 0855     Visit Number 9    Number of Visits 12    Date for PT Re-Evaluation 11/05/20    Authorization Type IE 08/13/2020    PT Start Time 0848    PT Stop Time 0930    PT Time Calculation (min) 42 min    Activity Tolerance Patient tolerated treatment well    Behavior During Therapy [facility] for tasks assessed/performed             History reviewed. No pertinent past medical history.  Past Surgical History:  Procedure Laterality Date   CESAREAN SECTION     HYSTEROSCOPY     LAPAROSCOPIC BILATERAL SALPINGECTOMY Bilateral 04/13/2015   Procedure: LAPAROSCOPIC BILATERAL SALPINGECTOMY;  Surgeon: Honor Loh Ward, MD;  Location: [facility] ORS;  Service: Gynecology;  Laterality: Bilateral;   LAPAROSCOPIC HYSTERECTOMY N/A 04/13/2015   Procedure: HYSTERECTOMY TOTAL LAPAROSCOPIC;  Surgeon: Honor Loh Ward, MD;  Location: [facility] ORS;  Service: Gynecology;  Laterality: N/A;   laparoscopy     TONSILLECTOMY     UMBILICAL HERNIA REPAIR  04/13/2015   Procedure: HERNIA REPAIR UMBILICAL ADULT;  Surgeon: Honor Loh Ward, MD;  Location: [facility] ORS;  Service: Gynecology;;    There were no vitals filed for this visit.   Subjective Assessment - 10/15/20 0850     Subjective Patient denies any new concerns since last week. Patient notes that she can tell she is doing the exercises because of muscle soreness. She does note that she has not even taken meloxicam this week and was able to do heavy cleaning without needing it. She does note that she kinda felt like she should've taken it, but wanted to test and see how it felt. Patient notes no pain but a little bit of  stiffness. Patient continues to note improvement with UI and urgency to the point that she is not needing a pad/pantyliner. Patient does use them as an insurance when she is out in the community with variable toilet access.    Currently in Pain? No/denies             TREATMENT  Neuromuscular Re-education: Supine hooklying diaphragmatic breathing with VCs and TCs for downregulation of the nervous system and improved management of IAP Supine hooklying, PFM contractions ( x5) with exhalation and gentle TrA activation. VCs and TCs to decrease compensatory patterns and encourage activation of the PFM. Reassessed goals; see below.    Patient educated throughout session on appropriate technique and form using multi-modal cueing, HEP, and activity modification. Patient articulated understanding and returned demonstration.  Patient Response to interventions: Comfortable ot return in 2 weeks for f/u and progression.  ASSESSMENT Patient presents to clinic with excellent motivation to participate in therapy. Patient demonstrates deficits in PFM coordination, PFM strength, scar mobility, intra-abdominal pressure management, pain, and posture. Patient able to coordinate 5 consecutive PFM contractions with excellent motor control/fluidity during today's session and responded positively to active interventions. Additionally, patient indicating goal achievement in all areas as set forth at outset of physical therapy; however, patient continues to have occasional UI which merits further intervention. Patient will benefit from continued skilled therapeutic intervention to address remaining deficits in PFM  coordination, PFM strength, scar mobility, intra-abdominal pressure management, pain, and posture in order to increase function and improve overall QOL.     PT Long Term Goals - 10/15/20 0857       PT LONG TERM GOAL #1   Title Patient will demonstrate independence with HEP in order to maximize  therapeutic gains and improve carryover from physical therapy sessions to ADLs in the home and community.    Baseline IE: not demonstrated; 10/7: IND    Time 12    Period Weeks    Status Achieved      PT LONG TERM GOAL #2   Title Patient will decrease worst pain as reported on NPRS by at least 2 points to demonstrate clinically significant reduction in pain in order to restore/improve function and overall QOL.    Baseline IE: 10+/10; 10/7: 1-2/10    Time 12    Period Weeks    Status Achieved      PT LONG TERM GOAL #3   Title Patient will demonstrate improved function as evidenced by a score of 69 on FOTO Urinary measure for full participation in activities at home and in the community.    Baseline IE: 60; 10/7: 69    Time 12    Period Weeks    Status Achieved      PT LONG TERM GOAL #4   Title Patient will demonstrate improved function as evidenced by a score of <26 on FOTO PFDI Pain measure for full participation in activities at home and in the community.    Baseline IE: 46; 10/7: 4    Time 12    Period Weeks    Status Achieved      PT LONG TERM GOAL #5   Title Patient will report urine leaking "never" with physical activity and before getting to the toilet to demonstrate complete resolution of complaint and allow for improved participation in the community.    Baseline 10/7: "once or less per week"    Time 3    Period Weeks    Status New    Target Date 11/05/20                   Plan - 10/15/20 1150     Clinical Impression Statement Patient presents to clinic with excellent motivation to participate in therapy. Patient demonstrates deficits in PFM coordination, PFM strength, scar mobility, intra-abdominal pressure management, pain, and posture. Patient able to coordinate 5 consecutive PFM contractions with excellent motor control/fluidity during today's session and responded positively to active interventions. Additionally, patient indicating goal achievement in all  areas as set forth at outset of physical therapy; however, patient continues to have occasional UI which merits further intervention. Patient will benefit from continued skilled therapeutic intervention to address remaining deficits in PFM coordination, PFM strength, scar mobility, intra-abdominal pressure management, pain, and posture in order to increase function and improve overall QOL.    Personal Factors and Comorbidities Comorbidity 3+;Behavior Pattern;Past/Current Experience;Time since onset of injury/illness/exacerbation    Comorbidities OSA, hx of endometriosis, hx of hysterectomy, chronic B LBP, chronic r knee pain    Examination-Activity Limitations Transfers;Sleep;Continence;Squat;Lift;Bend;Caring for Others    Examination-Participation Restrictions Interpersonal Relationship;Cleaning;Laundry;Yard Work;Community Activity    Stability/Clinical Decision Making Evolving/Moderate complexity    Rehab Potential Good    PT Frequency 1x / week    PT Duration 12 weeks    PT Treatment/Interventions Cryotherapy;Electrical Stimulation;Moist Heat;Therapeutic exercise;Neuromuscular re-education;Patient/family education;Manual techniques;Scar mobilization;Dry needling;Taping;Spinal Manipulations;Joint Manipulations    PT Next  Visit Plan abdominal wall, hips    PT Home Exercise Plan [email or web address]    Consulted and Agree with Plan of Care Patient             Patient will benefit from skilled therapeutic intervention in order to improve the following deficits and impairments:  Pain, Decreased coordination, Decreased strength, Increased fascial restricitons, Improper body mechanics, Decreased scar mobility, Increased muscle spasms  Visit Diagnosis: Pelvic pain  Other lack of coordination  Muscle weakness (generalized)     Problem List There are no problems to display for this patient.   Myles Gip PT, DPT [phone]  10/15/2020, 11:50 AM  [facility] [address] [place], [place], [address] Phone: [phone]   Fax:  [phone]  Name: Melanie Krause MRN: [id number] Date of Birth: 04/29/1970

## 2020-10-29 ENCOUNTER — Encounter: Payer: Self-pay | Admitting: Physical Therapy

## 2020-10-29 ENCOUNTER — Ambulatory Visit: Payer: Medicaid Other | Admitting: Physical Therapy

## 2020-10-29 ENCOUNTER — Other Ambulatory Visit: Payer: Self-pay

## 2020-10-29 DIAGNOSIS — M6281 Muscle weakness (generalized): Secondary | ICD-10-CM

## 2020-10-29 DIAGNOSIS — R102 Pelvic and perineal pain: Secondary | ICD-10-CM

## 2020-10-29 DIAGNOSIS — R278 Other lack of coordination: Secondary | ICD-10-CM

## 2020-10-29 NOTE — Therapy (Signed)
[facility] [address] [place], [place], [address] Phone: [phone]   Fax:  [phone]  Physical Therapy Treatment Physical Therapy Progress Note   Dates of reporting period  08/13/2020   to   10/29/2020   Patient Details  Name: Melanie Krause MRN: [id number] Date of Birth: 09-Jun-1970 Referring Provider (PT): Benjaman Kindler   Encounter Date: 10/29/2020   PT End of Session - 10/29/20 0901     Visit Number 10    Number of Visits 12    Date for PT Re-Evaluation 11/05/20    Authorization Type IE 08/13/2020    PT Start Time 0845    PT Stop Time 0930    PT Time Calculation (min) 45 min    Activity Tolerance Patient tolerated treatment well    Behavior During Therapy [facility] for tasks assessed/performed             History reviewed. No pertinent past medical history.  Past Surgical History:  Procedure Laterality Date   CESAREAN SECTION     HYSTEROSCOPY     LAPAROSCOPIC BILATERAL SALPINGECTOMY Bilateral 04/13/2015   Procedure: LAPAROSCOPIC BILATERAL SALPINGECTOMY;  Surgeon: Honor Loh Ward, MD;  Location: [facility] ORS;  Service: Gynecology;  Laterality: Bilateral;   LAPAROSCOPIC HYSTERECTOMY N/A 04/13/2015   Procedure: HYSTERECTOMY TOTAL LAPAROSCOPIC;  Surgeon: Honor Loh Ward, MD;  Location: [facility] ORS;  Service: Gynecology;  Laterality: N/A;   laparoscopy     TONSILLECTOMY     UMBILICAL HERNIA REPAIR  04/13/2015   Procedure: HERNIA REPAIR UMBILICAL ADULT;  Surgeon: Honor Loh Ward, MD;  Location: [facility] ORS;  Service: Gynecology;;    There were no vitals filed for this visit.   Subjective Assessment - 10/29/20 0850     Subjective Patient notes that she is still recovering from URI. She reports that she had a lot of increased abdominal discomfort with coughing and sneezing. Patient has been on prednisone cough medicine and nasal spray. Patient notes that even with all the coughing and sneezing, she had only one bout of UI which she  can attribute to prolonged delaying urination.    Currently in Pain? No/denies             TREATMENT  Neuromuscular Re-education: Supine hooklying diaphragmatic breathing with VCs and TCs for downregulation of the nervous system and improved management of IAP Supine hooklying, PFM contractions ( x6) with exhalation and gentle TrA activation. VCs and TCs to decrease compensatory patterns and encourage activation of the PFM. Supine hooklying, PFM endurance contractions ( 3 sec x3) with gentle TrA activation. VCs and TCs to decrease compensatory patterns and encourage activation of the PFM.    Patient educated throughout session on appropriate technique and form using multi-modal cueing, HEP, and activity modification. Patient articulated understanding and returned demonstration.  Patient Response to interventions: Comfortable to return in 2 weeks for f/u and progression.  ASSESSMENT Patient presents to clinic with excellent motivation to participate in therapy. Patient demonstrates deficits in PFM coordination, PFM strength, scar mobility, intra-abdominal pressure management, pain, and posture. Patient able to coordinate 6 consecutive PFM contractions with excellent motor control/fluidity during today's session and also able to coordinate same quality with endurance hold PFM contraction on 3 seconds on second and third repetition. Patient's condition has the potential to improve in response to therapy. Maximum improvement is yet to be obtained. The anticipated improvement is attainable and reasonable in a generally predictable time. Patient will benefit from continued skilled therapeutic intervention to address  remaining deficits in PFM coordination, PFM strength, scar mobility, intra-abdominal pressure management, pain, and posture in order to increase function and improve overall QOL.     PT Long Term Goals - 10/15/20 0857       PT LONG TERM GOAL #1   Title Patient will demonstrate  independence with HEP in order to maximize therapeutic gains and improve carryover from physical therapy sessions to ADLs in the home and community.    Baseline IE: not demonstrated; 10/7: IND    Time 12    Period Weeks    Status Achieved      PT LONG TERM GOAL #2   Title Patient will decrease worst pain as reported on NPRS by at least 2 points to demonstrate clinically significant reduction in pain in order to restore/improve function and overall QOL.    Baseline IE: 10+/10; 10/7: 1-2/10    Time 12    Period Weeks    Status Achieved      PT LONG TERM GOAL #3   Title Patient will demonstrate improved function as evidenced by a score of 69 on FOTO Urinary measure for full participation in activities at home and in the community.    Baseline IE: 60; 10/7: 69    Time 12    Period Weeks    Status Achieved      PT LONG TERM GOAL #4   Title Patient will demonstrate improved function as evidenced by a score of <26 on FOTO PFDI Pain measure for full participation in activities at home and in the community.    Baseline IE: 46; 10/7: 4    Time 12    Period Weeks    Status Achieved      PT LONG TERM GOAL #5   Title Patient will report urine leaking "never" with physical activity and before getting to the toilet to demonstrate complete resolution of complaint and allow for improved participation in the community.    Baseline 10/7: "once or less per week"    Time 3    Period Weeks    Status New    Target Date 11/05/20                   Plan - 10/29/20 1009     Clinical Impression Statement Patient presents to clinic with excellent motivation to participate in therapy. Patient demonstrates deficits in PFM coordination, PFM strength, scar mobility, intra-abdominal pressure management, pain, and posture. Patient able to coordinate 6 consecutive PFM contractions with excellent motor control/fluidity during today's session and also able to coordinate same quality with endurance hold PFM  contraction on 3 seconds on second and third repetition. Patient's condition has the potential to improve in response to therapy. Maximum improvement is yet to be obtained. The anticipated improvement is attainable and reasonable in a generally predictable time. Patient will benefit from continued skilled therapeutic intervention to address remaining deficits in PFM coordination, PFM strength, scar mobility, intra-abdominal pressure management, pain, and posture in order to increase function and improve overall QOL.    Personal Factors and Comorbidities Comorbidity 3+;Behavior Pattern;Past/Current Experience;Time since onset of injury/illness/exacerbation    Comorbidities OSA, hx of endometriosis, hx of hysterectomy, chronic B LBP, chronic r knee pain    Examination-Activity Limitations Transfers;Sleep;Continence;Squat;Lift;Bend;Caring for Others    Examination-Participation Restrictions Interpersonal Relationship;Cleaning;Laundry;Yard Work;Community Activity    Stability/Clinical Decision Making Evolving/Moderate complexity    Rehab Potential Good    PT Frequency 1x / week    PT Duration 12 weeks  PT Treatment/Interventions Cryotherapy;Electrical Stimulation;Moist Heat;Therapeutic exercise;Neuromuscular re-education;Patient/family education;Manual techniques;Scar mobilization;Dry needling;Taping;Spinal Manipulations;Joint Manipulations    PT Next Visit Plan abdominal wall, hips    PT Home Exercise Plan [email or web address]    Consulted and Agree with Plan of Care Patient             Patient will benefit from skilled therapeutic intervention in order to improve the following deficits and impairments:  Pain, Decreased coordination, Decreased strength, Increased fascial restricitons, Improper body mechanics, Decreased scar mobility, Increased muscle spasms  Visit Diagnosis: Pelvic pain  Other lack of coordination  Muscle weakness (generalized)     Problem List There are no problems to display  for this patient.   Myles Gip PT, DPT [phone]  10/29/2020, 10:09 AM  [facility] [address] [place], [place], [address] Phone: [phone]   Fax:  [phone]  Name: Melanie Krause MRN: [id number] Date of Birth: 01/23/1970

## 2020-11-12 ENCOUNTER — Encounter: Payer: Self-pay | Admitting: Physical Therapy

## 2020-11-26 ENCOUNTER — Other Ambulatory Visit: Payer: Self-pay

## 2020-11-26 ENCOUNTER — Encounter: Payer: Self-pay | Admitting: Physical Therapy

## 2020-11-26 ENCOUNTER — Ambulatory Visit: Payer: Medicaid Other | Attending: Obstetrics and Gynecology | Admitting: Physical Therapy

## 2020-11-26 DIAGNOSIS — M6281 Muscle weakness (generalized): Secondary | ICD-10-CM | POA: Insufficient documentation

## 2020-11-26 DIAGNOSIS — R102 Pelvic and perineal pain: Secondary | ICD-10-CM | POA: Diagnosis not present

## 2020-11-26 DIAGNOSIS — R278 Other lack of coordination: Secondary | ICD-10-CM | POA: Insufficient documentation

## 2020-11-26 NOTE — Therapy (Signed)
[facility] [address] [place], [place], [address] Phone: [phone]   Fax:  [phone]  Physical Therapy Treatment  Patient Details  Name: Melanie Krause MRN: [id number] Date of Birth: [birth year] Referring Provider (PT): Benjaman Kindler   Encounter Date: 11/26/2020   PT End of Session - 11/26/20 0906     Visit Number 11    Number of Visits 24    Date for PT Re-Evaluation 02/18/21    Authorization Type IE 08/13/2020; PN 10/29/2020    PT Start Time 0848    PT Stop Time 0930    PT Time Calculation (min) 42 min    Activity Tolerance Patient tolerated treatment well    Behavior During Therapy [facility] for tasks assessed/performed             History reviewed. No pertinent past medical history.  Past Surgical History:  Procedure Laterality Date   CESAREAN SECTION     HYSTEROSCOPY     LAPAROSCOPIC BILATERAL SALPINGECTOMY Bilateral 04/13/2015   Procedure: LAPAROSCOPIC BILATERAL SALPINGECTOMY;  Surgeon: Honor Loh Ward, MD;  Location: [facility]S;  Service: Gynecology;  Laterality: Bilateral;   LAPAROSCOPIC HYSTERECTOMY N/A 04/13/2015   Procedure: HYSTERECTOMY TOTAL LAPAROSCOPIC;  Surgeon: Honor Loh Ward, MD;  Location: [facility];  Service: Gynecology;  Laterality: N/A;   laparoscopy     TONSILLECTOMY     UMBILICAL HERNIA REPAIR  04/13/2015   Procedure: HERNIA REPAIR UMBILICAL ADULT;  Surgeon: Honor Loh Ward, MD;  Location: [facility] ORS;  Service: Gynecology;;    There were no vitals filed for this visit.   Subjective Assessment - 11/26/20 0850     Subjective Patient notes that with recen child hospitalization and subsequent care required, she has had no more than 4 hours of sleep. Patient notes that leakage has not increased, but her urgency is increasing with increased water intake.             TREATMENT  Neuromuscular Re-education: Patient education on strategies for re-initiating HEP and progressing back to prior level in  the context of increased personal stressors and scheduling limitations. Reviewed urge suppression and typical bladder function for improved management of symptoms.    Patient educated throughout session on appropriate technique and form using multi-modal cueing, HEP, and activity modification. Patient articulated understanding and returned demonstration.  Patient Response to interventions: Comfortable to return after Thanksgiving.  ASSESSMENT Patient presents to clinic with excellent motivation to participate in therapy. Patient demonstrates deficits in PFM coordination, PFM strength, scar mobility, intra-abdominal pressure management, pain, and posture. Despite recent hiatus in therapy 2/2 to caregiver demands, patient has been able to maintain her previously noted progress in physical therapy. Patient articulating good understanding of exercise regressions and progressions and indicating good application. Due to presence of continued urinary urgency and occasional/rare UUI, patient will benefit from continued skilled therapeutic intervention to address remaining deficits in PFM coordination, PFM strength, scar mobility, intra-abdominal pressure management, pain, and posture in order to increase function and improve overall QOL.    PT Long Term Goals - 10/15/20 0857       PT LONG TERM GOAL #1   Title Patient will demonstrate independence with HEP in order to maximize therapeutic gains and improve carryover from physical therapy sessions to ADLs in the home and community.    Baseline IE: not demonstrated; 10/7: IND    Time 12    Period Weeks    Status Achieved      PT LONG TERM  GOAL #2   Title Patient will decrease worst pain as reported on NPRS by at least 2 points to demonstrate clinically significant reduction in pain in order to restore/improve function and overall QOL.    Baseline IE: 10+/10; 10/7: 1-2/10    Time 12    Period Weeks    Status Achieved      PT LONG TERM GOAL #3    Title Patient will demonstrate improved function as evidenced by a score of 69 on FOTO Urinary measure for full participation in activities at home and in the community.    Baseline IE: 60; 10/7: 69    Time 12    Period Weeks    Status Achieved      PT LONG TERM GOAL #4   Title Patient will demonstrate improved function as evidenced by a score of <26 on FOTO PFDI Pain measure for full participation in activities at home and in the community.    Baseline IE: 46; 10/7: 4    Time 12    Period Weeks    Status Achieved      PT LONG TERM GOAL #5   Title Patient will report urine leaking "never" with physical activity and before getting to the toilet to demonstrate complete resolution of complaint and allow for improved participation in the community.    Baseline 10/7: "once or less per week"    Time 3    Period Weeks    Status New    Target Date 11/05/20                   Plan - 11/26/20 1141     Clinical Impression Statement Patient presents to clinic with excellent motivation to participate in therapy. Patient demonstrates deficits in PFM coordination, PFM strength, scar mobility, intra-abdominal pressure management, pain, and posture. Despite recent hiatus in therapy 2/2 to caregiver demands, patient has been able to maintain her previously noted progress in physical therapy. Patient articulating good understanding of exercise regressions and progressions and indicating good application. Due to presence of continued urinary urgency and occasional/rare UUI, patient will benefit from continued skilled therapeutic intervention to address remaining deficits in PFM coordination, PFM strength, scar mobility, intra-abdominal pressure management, pain, and posture in order to increase function and improve overall QOL.    Personal Factors and Comorbidities Comorbidity 3+;Behavior Pattern;Past/Current Experience;Time since onset of injury/illness/exacerbation    Comorbidities OSA, hx of  endometriosis, hx of hysterectomy, chronic B LBP, chronic r knee pain    Examination-Activity Limitations Transfers;Sleep;Continence;Squat;Lift;Bend;Caring for Others    Examination-Participation Restrictions Interpersonal Relationship;Cleaning;Laundry;Yard Work;Community Activity    Stability/Clinical Decision Making Evolving/Moderate complexity    Rehab Potential Good    PT Frequency 1x / week    PT Duration 12 weeks    PT Treatment/Interventions Cryotherapy;Electrical Stimulation;Moist Heat;Therapeutic exercise;Neuromuscular re-education;Patient/family education;Manual techniques;Scar mobilization;Dry needling;Taping;Spinal Manipulations;Joint Manipulations    PT Next Visit Plan abdominal wall, hips    PT Home Exercise Plan [id number]    Consulted and Agree with Plan of Care Patient             Patient will benefit from skilled therapeutic intervention in order to improve the following deficits and impairments:  Pain, Decreased coordination, Decreased strength, Increased fascial restricitons, Improper body mechanics, Decreased scar mobility, Increased muscle spasms  Visit Diagnosis: Pelvic pain  Other lack of coordination  Muscle weakness (generalized)     Problem List There are no problems to display for this patient.   Myles Gip PT, DPT [phone]  11/26/2020, 12:02 PM  [facility] [address] [place], [place], [address] Phone: [phone]   Fax:  [phone]  Name: Melanie Krause MRN: [id number] Date of Birth: 1971-01-05

## 2020-12-24 ENCOUNTER — Ambulatory Visit: Payer: Medicaid Other | Attending: Obstetrics and Gynecology | Admitting: Physical Therapy

## 2020-12-24 ENCOUNTER — Encounter: Payer: Self-pay | Admitting: Physical Therapy

## 2020-12-24 ENCOUNTER — Other Ambulatory Visit: Payer: Self-pay

## 2020-12-24 DIAGNOSIS — M6281 Muscle weakness (generalized): Secondary | ICD-10-CM | POA: Diagnosis present

## 2020-12-24 DIAGNOSIS — R278 Other lack of coordination: Secondary | ICD-10-CM | POA: Insufficient documentation

## 2020-12-24 DIAGNOSIS — R102 Pelvic and perineal pain: Secondary | ICD-10-CM | POA: Insufficient documentation

## 2020-12-24 NOTE — Therapy (Signed)
[facility] [address] [place], [place], [address] Phone: [phone]   Fax:  [phone]  Physical Therapy Treatment  Patient Details  Name: Melanie Krause MRN: [id number] Date of Birth: 1970/10/29 Referring Provider (PT): Benjaman Kindler   Encounter Date: 12/24/2020   PT End of Session - 12/24/20 0855     Visit Number 12    Number of Visits 24    Date for PT Re-Evaluation 02/18/21    Authorization Type IE 08/13/2020; PN 10/29/2020    PT Start Time 0847    PT Stop Time 0930    PT Time Calculation (min) 43 min    Activity Tolerance Patient tolerated treatment well    Behavior During Therapy [facility] for tasks assessed/performed             History reviewed. No pertinent past medical history.  Past Surgical History:  Procedure Laterality Date   CESAREAN SECTION     HYSTEROSCOPY     LAPAROSCOPIC BILATERAL SALPINGECTOMY Bilateral 04/13/2015   Procedure: LAPAROSCOPIC BILATERAL SALPINGECTOMY;  Surgeon: Honor Loh Ward, MD;  Location: [facility];  Service: Gynecology;  Laterality: Bilateral;   LAPAROSCOPIC HYSTERECTOMY N/A 04/13/2015   Procedure: HYSTERECTOMY TOTAL LAPAROSCOPIC;  Surgeon: Honor Loh Ward, MD;  Location: [facility];  Service: Gynecology;  Laterality: N/A;   laparoscopy     TONSILLECTOMY     UMBILICAL HERNIA REPAIR  04/13/2015   Procedure: HERNIA REPAIR UMBILICAL ADULT;  Surgeon: Honor Loh Ward, MD;  Location: [facility];  Service: Gynecology;;    There were no vitals filed for this visit.   Subjective Assessment - 12/24/20 0853     Subjective Patient reports that she has been busy and has been a little less consistent with HEP. Patient was able to control bowels and leakage during colonoscopy prep.    Currently in Pain? No/denies                TREATMENT  Neuromuscular Re-education: Reviewed HEP and activity pacing strategy to improve consistency with self-management outside of clinic.    Patient  educated throughout session on appropriate technique and form using multi-modal cueing, HEP, and activity modification. Patient articulated understanding and returned demonstration.  Patient Response to interventions: Comfortable to return in 1 week for gym based program.  ASSESSMENT Patient presents to clinic with excellent motivation to participate in therapy. Patient demonstrates deficits in PFM coordination, PFM strength, scar mobility, intra-abdominal pressure management, pain, and posture. Patient continues to indicate maintenance of progress despite high stress and increased caregiver demands. Patient and PT discussed progression to gym based program to assess PFM endurance with higher intensity activities in order to promote optimal health outcomes. Due to presence of continued urinary urgency and occasional/rare UUI, patient will benefit from continued skilled therapeutic intervention to address remaining deficits in PFM coordination, PFM strength, scar mobility, intra-abdominal pressure management, pain, and posture in order to increase function and improve overall QOL.    PT Long Term Goals - 12/24/20 0947       PT LONG TERM GOAL #1   Title Patient will demonstrate independence with HEP in order to maximize therapeutic gains and improve carryover from physical therapy sessions to ADLs in the home and community.    Baseline IE: not demonstrated; 10/7: IND    Time 12    Period Weeks    Status Achieved      PT LONG TERM GOAL #2   Title Patient will decrease worst pain as reported on  NPRS by at least 2 points to demonstrate clinically significant reduction in pain in order to restore/improve function and overall QOL.    Baseline IE: 10+/10; 10/7: 1-2/10    Time 12    Period Weeks    Status Achieved      PT LONG TERM GOAL #3   Title Patient will demonstrate improved function as evidenced by a score of 69 on FOTO Urinary measure for full participation in activities at home and in the  community.    Baseline IE: 60; 10/7: 69    Time 12    Period Weeks    Status Achieved      PT LONG TERM GOAL #4   Title Patient will demonstrate improved function as evidenced by a score of <26 on FOTO PFDI Pain measure for full participation in activities at home and in the community.    Baseline IE: 46; 10/7: 4    Time 12    Period Weeks    Status Achieved      PT LONG TERM GOAL #5   Title Patient will report urine leaking "never" with physical activity and before getting to the toilet to demonstrate complete resolution of complaint and allow for improved participation in the community.    Baseline 10/7: "once or less per week"    Time 8    Period Weeks    Status New    Target Date 02/18/21      Additional Long Term Goals   Additional Long Term Goals Yes      PT LONG TERM GOAL #6   Title Patient will be able to participate in gym based exercise program including cario and weight training for 40 minutes without need to void and UI in order to independently manage overall health and decrease risk of adverse health events.    Baseline 12/24/2020: has not attempted    Time 8    Period Weeks    Status New    Target Date 02/18/21                   Plan - 12/24/20 0857     Clinical Impression Statement Patient presents to clinic with excellent motivation to participate in therapy. Patient demonstrates deficits in PFM coordination, PFM strength, scar mobility, intra-abdominal pressure management, pain, and posture. Patient continues to indicate maintenance of progress despite high stress and increased caregiver demands. Patient and PT discussed progression to gym based program to assess PFM endurance with higher intensity activities in order to promote optimal health outcomes. Due to presence of continued urinary urgency and occasional/rare UUI, patient will benefit from continued skilled therapeutic intervention to address remaining deficits in PFM coordination, PFM strength,  scar mobility, intra-abdominal pressure management, pain, and posture in order to increase function and improve overall QOL.    Personal Factors and Comorbidities Comorbidity 3+;Behavior Pattern;Past/Current Experience;Time since onset of injury/illness/exacerbation    Comorbidities OSA, hx of endometriosis, hx of hysterectomy, chronic B LBP, chronic r knee pain    Examination-Activity Limitations Transfers;Sleep;Continence;Squat;Lift;Bend;Caring for Others    Examination-Participation Restrictions Interpersonal Relationship;Cleaning;Laundry;Yard Work;Community Activity    Stability/Clinical Decision Making Evolving/Moderate complexity    Rehab Potential Good    PT Frequency 1x / week    PT Duration 12 weeks    PT Treatment/Interventions Cryotherapy;Electrical Stimulation;Moist Heat;Therapeutic exercise;Neuromuscular re-education;Patient/family education;Manual techniques;Scar mobilization;Dry needling;Taping;Spinal Manipulations;Joint Manipulations    PT Next Visit Plan gym based program with cardio and strength training circuit    [place]  Consulted and Agree with Plan of Care Patient             Patient will benefit from skilled therapeutic intervention in order to improve the following deficits and impairments:  Pain, Decreased coordination, Decreased strength, Increased fascial restricitons, Improper body mechanics, Decreased scar mobility, Increased muscle spasms  Visit Diagnosis: Pelvic pain  Other lack of coordination  Muscle weakness (generalized)     Problem List There are no problems to display for this patient.   Myles Gip PT, DPT [phone]  12/24/2020, 9:56 AM  [facility] [address] [place], [place], [address] Phone: [phone]   Fax:  [phone]  Name: Melanie Krause MRN: [id number] Date of Birth: March 06, 1970

## 2020-12-31 ENCOUNTER — Ambulatory Visit: Payer: Medicaid Other | Admitting: Physical Therapy

## 2020-12-31 ENCOUNTER — Other Ambulatory Visit: Payer: Self-pay

## 2020-12-31 ENCOUNTER — Encounter: Payer: Self-pay | Admitting: Physical Therapy

## 2020-12-31 DIAGNOSIS — R278 Other lack of coordination: Secondary | ICD-10-CM

## 2020-12-31 DIAGNOSIS — M6281 Muscle weakness (generalized): Secondary | ICD-10-CM

## 2020-12-31 DIAGNOSIS — R102 Pelvic and perineal pain: Secondary | ICD-10-CM

## 2020-12-31 NOTE — Therapy (Signed)
[place] [facility] [address] [place], [place], [address] Phone: [phone]   Fax:  [phone]  Physical Therapy Treatment  Patient Details  Name: Melanie Krause MRN: [id number] Date of Birth: 08/30/1970 Referring Provider (PT): Benjaman Kindler   Encounter Date: 12/31/2020   PT End of Session - 12/31/20 1003     Visit Number 13    Number of Visits 24    Date for PT Re-Evaluation 02/18/21    Authorization Type IE 08/13/2020; PN 10/29/2020    PT Start Time 0851    PT Stop Time 0935    PT Time Calculation (min) 44 min    Activity Tolerance Patient tolerated treatment well    Behavior During Therapy [facility] for tasks assessed/performed             History reviewed. No pertinent past medical history.  Past Surgical History:  Procedure Laterality Date   CESAREAN SECTION     HYSTEROSCOPY     LAPAROSCOPIC BILATERAL SALPINGECTOMY Bilateral 04/13/2015   Procedure: LAPAROSCOPIC BILATERAL SALPINGECTOMY;  Surgeon: Honor Loh Ward, MD;  Location: [facility];  Service: Gynecology;  Laterality: Bilateral;   LAPAROSCOPIC HYSTERECTOMY N/A 04/13/2015   Procedure: HYSTERECTOMY TOTAL LAPAROSCOPIC;  Surgeon: Honor Loh Ward, MD;  Location: [facility];  Service: Gynecology;  Laterality: N/A;   laparoscopy     TONSILLECTOMY     UMBILICAL HERNIA REPAIR  04/13/2015   Procedure: HERNIA REPAIR UMBILICAL ADULT;  Surgeon: Honor Loh Ward, MD;  Location: [facility];  Service: Gynecology;;    There were no vitals filed for this visit.   Subjective Assessment - 12/31/20 0853     Subjective Patient notes she has been very busy and stressed this past week. Patient has been doing a lot of walking and up and down steps with preparing for the holidays.    Currently in Pain? No/denies             TREATMENT  Therapeutic Exercise: Graded exposure: treadmill walking, progressive pace from 1.0-2.0, and incline 0.5. Patient monitored throughout. Standing postural  strengthening: GTB  Shoulder extension to fatigue  Rows to fatigufe Manual Therapy: STM and TPR performed to B thoracic and lumbar paraspinal mm, B periscapular mm to allow for decreased tension and pain and improved posture and function   Patient educated throughout session on appropriate technique and form using multi-modal cueing, HEP, and activity modification. Patient articulated understanding and returned demonstration.  Patient Response to interventions: Comfortable to return in 2 weeks  ASSESSMENT Patient presents to clinic with excellent motivation to participate in therapy. Patient demonstrates deficits in PFM coordination, PFM strength, scar mobility, intra-abdominal pressure management, pain, and posture. Patient able to perform abbreviated gym-based program with good form and tolerance during today's session and responded positively to manual interventions for pain modulation. Patient will benefit from continued skilled therapeutic intervention to address remaining deficits in PFM coordination, PFM strength, scar mobility, intra-abdominal pressure management, pain, and posture in order to increase function and improve overall QOL.    PT Long Term Goals - 12/24/20 0947       PT LONG TERM GOAL #1   Title Patient will demonstrate independence with HEP in order to maximize therapeutic gains and improve carryover from physical therapy sessions to ADLs in the home and community.    Baseline IE: not demonstrated; 10/7: IND    Time 12    Period Weeks    Status Achieved      PT LONG TERM GOAL #  2   Title Patient will decrease worst pain as reported on NPRS by at least 2 points to demonstrate clinically significant reduction in pain in order to restore/improve function and overall QOL.    Baseline IE: 10+/10; 10/7: 1-2/10    Time 12    Period Weeks    Status Achieved      PT LONG TERM GOAL #3   Title Patient will demonstrate improved function as evidenced by a score of 69 on FOTO  Urinary measure for full participation in activities at home and in the community.    Baseline IE: 60; 10/7: 69    Time 12    Period Weeks    Status Achieved      PT LONG TERM GOAL #4   Title Patient will demonstrate improved function as evidenced by a score of <26 on FOTO PFDI Pain measure for full participation in activities at home and in the community.    Baseline IE: 46; 10/7: 4    Time 12    Period Weeks    Status Achieved      PT LONG TERM GOAL #5   Title Patient will report urine leaking "never" with physical activity and before getting to the toilet to demonstrate complete resolution of complaint and allow for improved participation in the community.    Baseline 10/7: "once or less per week"    Time 8    Period Weeks    Status New    Target Date 02/18/21      Additional Long Term Goals   Additional Long Term Goals Yes      PT LONG TERM GOAL #6   Title Patient will be able to participate in gym based exercise program including cario and weight training for 40 minutes without need to void and UI in order to independently manage overall health and decrease risk of adverse health events.    Baseline 12/24/2020: has not attempted    Time 8    Period Weeks    Status New    Target Date 02/18/21                   Plan - 12/31/20 1004     Clinical Impression Statement Patient presents to clinic with excellent motivation to participate in therapy. Patient demonstrates deficits in PFM coordination, PFM strength, scar mobility, intra-abdominal pressure management, pain, and posture. Patient able to perform abbreviated gym-based program with good form and tolerance during today's session and responded positively to manual interventions for pain modulation. Patient will benefit from continued skilled therapeutic intervention to address remaining deficits in PFM coordination, PFM strength, scar mobility, intra-abdominal pressure management, pain, and posture in order to  increase function and improve overall QOL.    Personal Factors and Comorbidities Comorbidity 3+;Behavior Pattern;Past/Current Experience;Time since onset of injury/illness/exacerbation    Comorbidities OSA, hx of endometriosis, hx of hysterectomy, chronic B LBP, chronic r knee pain    Examination-Activity Limitations Transfers;Sleep;Continence;Squat;Lift;Bend;Caring for Others    Examination-Participation Restrictions Interpersonal Relationship;Cleaning;Laundry;Yard Work;Community Activity    Stability/Clinical Decision Making Evolving/Moderate complexity    Rehab Potential Good    PT Frequency 1x / week    PT Duration 12 weeks    PT Treatment/Interventions Cryotherapy;Electrical Stimulation;Moist Heat;Therapeutic exercise;Neuromuscular re-education;Patient/family education;Manual techniques;Scar mobilization;Dry needling;Taping;Spinal Manipulations;Joint Manipulations    PT Next Visit Plan gym based program with cardio and strength training circuit    PT Home Exercise Plan [email or web address]    Consulted and Agree with Plan of Care Patient  Patient will benefit from skilled therapeutic intervention in order to improve the following deficits and impairments:  Pain, Decreased coordination, Decreased strength, Increased fascial restricitons, Improper body mechanics, Decreased scar mobility, Increased muscle spasms  Visit Diagnosis: Pelvic pain  Other lack of coordination  Muscle weakness (generalized)     Problem List There are no problems to display for this patient.   Myles Gip PT, DPT [phone]  12/31/2020, 10:13 AM  [place] [facility] [address] [place], [place], [address] Phone: [phone]   Fax:  [phone]  Name: Melanie Krause MRN: [id number] Date of Birth: Aug 24, 1970

## 2021-01-14 ENCOUNTER — Other Ambulatory Visit: Payer: Self-pay

## 2021-01-14 ENCOUNTER — Encounter: Payer: Self-pay | Admitting: Physical Therapy

## 2021-01-14 ENCOUNTER — Ambulatory Visit: Payer: Medicaid Other | Attending: Obstetrics and Gynecology | Admitting: Physical Therapy

## 2021-01-14 DIAGNOSIS — R102 Pelvic and perineal pain: Secondary | ICD-10-CM | POA: Diagnosis present

## 2021-01-14 DIAGNOSIS — R278 Other lack of coordination: Secondary | ICD-10-CM | POA: Insufficient documentation

## 2021-01-14 DIAGNOSIS — M6281 Muscle weakness (generalized): Secondary | ICD-10-CM | POA: Diagnosis present

## 2021-01-14 NOTE — Therapy (Signed)
[place] [facility] [address] [place], [place], [address] Phone: [phone]   Fax:  [phone]  Physical Therapy Treatment  Patient Details  Name: Melanie Krause MRN: [id number] Date of Birth: Feb 11, 1970 Referring Provider (PT): Benjaman Kindler   Encounter Date: 01/14/2021   PT End of Session - 01/14/21 0947     Visit Number 14    Number of Visits 24    Date for PT Re-Evaluation 02/18/21    Authorization Type IE 08/13/2020; PN 10/29/2020    PT Start Time 0941    PT Stop Time 1015    PT Time Calculation (min) 34 min    Activity Tolerance Patient tolerated treatment well    Behavior During Therapy [facility] for tasks assessed/performed             History reviewed. No pertinent past medical history.  Past Surgical History:  Procedure Laterality Date   CESAREAN SECTION     HYSTEROSCOPY     LAPAROSCOPIC BILATERAL SALPINGECTOMY Bilateral 04/13/2015   Procedure: LAPAROSCOPIC BILATERAL SALPINGECTOMY;  Surgeon: Honor Loh Ward, MD;  Location: [facility];  Service: Gynecology;  Laterality: Bilateral;   LAPAROSCOPIC HYSTERECTOMY N/A 04/13/2015   Procedure: HYSTERECTOMY TOTAL LAPAROSCOPIC;  Surgeon: Honor Loh Ward, MD;  Location: [facility];  Service: Gynecology;  Laterality: N/A;   laparoscopy     TONSILLECTOMY     UMBILICAL HERNIA REPAIR  04/13/2015   Procedure: HERNIA REPAIR UMBILICAL ADULT;  Surgeon: Honor Loh Ward, MD;  Location: [facility];  Service: Gynecology;;    There were no vitals filed for this visit.   Subjective Assessment - 01/14/21 0945     Subjective Patient notes that she has been helping her brother move, but her back has held up fairly well. She has found the exercises from last session to be useful.    Currently in Pain? No/denies            TREATMENT  Therapeutic Exercise: Squat with Chair Touch - 10 reps Single Leg Squat with Chair Touch - 5 reps Seated Marching with Opposite Shoulder Flexion - 10 reps Seated  Hip Hinge with Dowel - 10 reps Patient education on layering TrA activation with above exercises as well as ADLs. Patient educated on utilizing mind-muscle connection to create productive resistance performing ADLs on busy days when setting aside time for HEP is difficult.   Patient educated throughout session on appropriate technique and form using multi-modal cueing, HEP, and activity modification. Patient articulated understanding and returned demonstration.  Patient Response to interventions: Comfortable to trial new HEP (as above)  ASSESSMENT Patient presents to clinic with excellent motivation to participate in therapy. Patient demonstrates deficits in PFM coordination, PFM strength, scar mobility, intra-abdominal pressure management, pain, and posture. Patient had good form with LQ strengthening activities during today's session and responded positively to active interventions. Patient will benefit from continued skilled therapeutic intervention to address remaining deficits in PFM coordination, PFM strength, scar mobility, intra-abdominal pressure management, pain, and posture in order to increase function and improve overall QOL.   PT Long Term Goals - 12/24/20 0947       PT LONG TERM GOAL #1   Title Patient will demonstrate independence with HEP in order to maximize therapeutic gains and improve carryover from physical therapy sessions to ADLs in the home and community.    Baseline IE: not demonstrated; 10/7: IND    Time 12    Period Weeks    Status Achieved  PT LONG TERM GOAL #2   Title Patient will decrease worst pain as reported on NPRS by at least 2 points to demonstrate clinically significant reduction in pain in order to restore/improve function and overall QOL.    Baseline IE: 10+/10; 10/7: 1-2/10    Time 12    Period Weeks    Status Achieved      PT LONG TERM GOAL #3   Title Patient will demonstrate improved function as evidenced by a score of 69 on FOTO Urinary  measure for full participation in activities at home and in the community.    Baseline IE: 60; 10/7: 69    Time 12    Period Weeks    Status Achieved      PT LONG TERM GOAL #4   Title Patient will demonstrate improved function as evidenced by a score of <26 on FOTO PFDI Pain measure for full participation in activities at home and in the community.    Baseline IE: 46; 10/7: 4    Time 12    Period Weeks    Status Achieved      PT LONG TERM GOAL #5   Title Patient will report urine leaking "never" with physical activity and before getting to the toilet to demonstrate complete resolution of complaint and allow for improved participation in the community.    Baseline 10/7: "once or less per week"    Time 8    Period Weeks    Status New    Target Date 02/18/21      Additional Long Term Goals   Additional Long Term Goals Yes      PT LONG TERM GOAL #6   Title Patient will be able to participate in gym based exercise program including cario and weight training for 40 minutes without need to void and UI in order to independently manage overall health and decrease risk of adverse health events.    Baseline 12/24/2020: has not attempted    Time 8    Period Weeks    Status New    Target Date 02/18/21                   Plan - 01/14/21 0947     Clinical Impression Statement Patient presents to clinic with excellent motivation to participate in therapy. Patient demonstrates deficits in PFM coordination, PFM strength, scar mobility, intra-abdominal pressure management, pain, and posture. Patient had good form with LQ strengthening activities during today's session and responded positively to active interventions. Patient will benefit from continued skilled therapeutic intervention to address remaining deficits in PFM coordination, PFM strength, scar mobility, intra-abdominal pressure management, pain, and posture in order to increase function and improve overall QOL.    Personal Factors  and Comorbidities Comorbidity 3+;Behavior Pattern;Past/Current Experience;Time since onset of injury/illness/exacerbation    Comorbidities OSA, hx of endometriosis, hx of hysterectomy, chronic B LBP, chronic r knee pain    Examination-Activity Limitations Transfers;Sleep;Continence;Squat;Lift;Bend;Caring for Others    Examination-Participation Restrictions Interpersonal Relationship;Cleaning;Laundry;Yard Work;Community Activity    Stability/Clinical Decision Making Evolving/Moderate complexity    Rehab Potential Good    PT Frequency 1x / week    PT Duration 12 weeks    PT Treatment/Interventions Cryotherapy;Electrical Stimulation;Moist Heat;Therapeutic exercise;Neuromuscular re-education;Patient/family education;Manual techniques;Scar mobilization;Dry needling;Taping;Spinal Manipulations;Joint Manipulations    PT Next Visit Plan gym based program with cardio and strength training circuit    PT Home Exercise Plan [email or web address]    Consulted and Agree with Plan of Care Patient  Patient will benefit from skilled therapeutic intervention in order to improve the following deficits and impairments:  Pain, Decreased coordination, Decreased strength, Increased fascial restricitons, Improper body mechanics, Decreased scar mobility, Increased muscle spasms  Visit Diagnosis: Pelvic pain  Other lack of coordination  Muscle weakness (generalized)     Problem List There are no problems to display for this patient.   Myles Gip PT, DPT [phone]  01/14/2021, 10:51 AM  [place] [facility] [address] [place], [place], [address] Phone: [phone]   Fax:  [phone]  Name: Melanie Krause MRN: [id number] Date of Birth: [birth year]

## 2021-01-21 ENCOUNTER — Encounter: Payer: Self-pay | Admitting: Physical Therapy

## 2021-01-28 ENCOUNTER — Encounter: Payer: Self-pay | Admitting: Physical Therapy

## 2021-02-04 ENCOUNTER — Encounter: Payer: Self-pay | Admitting: Physical Therapy

## 2021-02-11 ENCOUNTER — Ambulatory Visit: Payer: Medicaid Other | Admitting: Physical Therapy

## 2021-02-16 ENCOUNTER — Encounter: Payer: Self-pay | Admitting: Physical Therapy

## 2021-02-16 ENCOUNTER — Ambulatory Visit: Payer: Medicaid Other | Attending: Obstetrics and Gynecology | Admitting: Physical Therapy

## 2021-02-16 ENCOUNTER — Other Ambulatory Visit: Payer: Self-pay

## 2021-02-16 DIAGNOSIS — R278 Other lack of coordination: Secondary | ICD-10-CM | POA: Diagnosis present

## 2021-02-16 DIAGNOSIS — M6281 Muscle weakness (generalized): Secondary | ICD-10-CM | POA: Diagnosis present

## 2021-02-16 DIAGNOSIS — R102 Pelvic and perineal pain: Secondary | ICD-10-CM | POA: Diagnosis present

## 2021-02-16 NOTE — Therapy (Signed)
[facility] [address] [place], [place], [address] Phone: [phone]   Fax:  [phone]  Physical Therapy Treatment  Patient Details  Name: Melanie Krause MRN: [id number] Date of Birth: Aug 22, 1970 Referring Provider (PT): Benjaman Kindler   Encounter Date: 02/16/2021   PT End of Session - 02/16/21 1426     Visit Number 15    Number of Visits 24    Date for PT Re-Evaluation 02/18/21    Authorization Type IE 08/13/2020; PN 10/29/2020    PT Start Time 1420    PT Stop Time 1500    PT Time Calculation (min) 40 min    Activity Tolerance Patient tolerated treatment well    Behavior During Therapy [facility] for tasks assessed/performed             History reviewed. No pertinent past medical history.  Past Surgical History:  Procedure Laterality Date   CESAREAN SECTION     HYSTEROSCOPY     LAPAROSCOPIC BILATERAL SALPINGECTOMY Bilateral 04/13/2015   Procedure: LAPAROSCOPIC BILATERAL SALPINGECTOMY;  Surgeon: Honor Loh Ward, MD;  Location: [facility] ORS;  Service: Gynecology;  Laterality: Bilateral;   LAPAROSCOPIC HYSTERECTOMY N/A 04/13/2015   Procedure: HYSTERECTOMY TOTAL LAPAROSCOPIC;  Surgeon: Honor Loh Ward, MD;  Location: [facility] ORS;  Service: Gynecology;  Laterality: N/A;   laparoscopy     TONSILLECTOMY     UMBILICAL HERNIA REPAIR  04/13/2015   Procedure: HERNIA REPAIR UMBILICAL ADULT;  Surgeon: Honor Loh Ward, MD;  Location: [facility] ORS;  Service: Gynecology;;    There were no vitals filed for this visit.   Subjective Assessment - 02/16/21 1423     Subjective Patient reports that she has been really stressed as of late with he ryoungest child requiring surgery. Patient has not noticed increased pelvic pain as a result. Patient did use this past weekend to rest and recover. She reports that she has started to get back to exercises as things have settled down at home.    Currently in Pain? No/denies              TREATMENT  Neuromuscular Re-education: Discussed graded exposure and graded activity approaches to return to gym-based program.  Patient educated throughout session on appropriate technique and form using multi-modal cueing, HEP, and activity modification. Patient articulated understanding and returned demonstration.  Patient Response to interventions: Comfortable to trial gym based program with return in 2 weeks for potential d/c.  ASSESSMENT Patient presents to clinic with excellent motivation to participate in therapy. Patient demonstrates deficits in PFM coordination, PFM strength, scar mobility, intra-abdominal pressure management, pain, and posture. Patient articulating excellent reasoning and planning for return to gym-based exercise program during today's session and responded positively to educational interventions. Patient will benefit from continued skilled therapeutic intervention to address remaining deficits in PFM coordination, PFM strength, scar mobility, intra-abdominal pressure management, pain, and posture in order to increase function and improve overall QOL.    PT Long Term Goals - 12/24/20 0947       PT LONG TERM GOAL #1   Title Patient will demonstrate independence with HEP in order to maximize therapeutic gains and improve carryover from physical therapy sessions to ADLs in the home and community.    Baseline IE: not demonstrated; 10/7: IND    Time 12    Period Weeks    Status Achieved      PT LONG TERM GOAL #2   Title Patient will decrease worst pain as reported on NPRS by  at least 2 points to demonstrate clinically significant reduction in pain in order to restore/improve function and overall QOL.    Baseline IE: 10+/10; 10/7: 1-2/10    Time 12    Period Weeks    Status Achieved      PT LONG TERM GOAL #3   Title Patient will demonstrate improved function as evidenced by a score of 69 on FOTO Urinary measure for full participation in activities at home  and in the community.    Baseline IE: 60; 10/7: 69    Time 12    Period Weeks    Status Achieved      PT LONG TERM GOAL #4   Title Patient will demonstrate improved function as evidenced by a score of <26 on FOTO PFDI Pain measure for full participation in activities at home and in the community.    Baseline IE: 46; 10/7: 4    Time 12    Period Weeks    Status Achieved      PT LONG TERM GOAL #5   Title Patient will report urine leaking "never" with physical activity and before getting to the toilet to demonstrate complete resolution of complaint and allow for improved participation in the community.    Baseline 10/7: "once or less per week"    Time 8    Period Weeks    Status New    Target Date 02/18/21      Additional Long Term Goals   Additional Long Term Goals Yes      PT LONG TERM GOAL #6   Title Patient will be able to participate in gym based exercise program including cario and weight training for 40 minutes without need to void and UI in order to independently manage overall health and decrease risk of adverse health events.    Baseline 12/24/2020: has not attempted    Time 8    Period Weeks    Status New    Target Date 02/18/21                   Plan - 02/16/21 1427     Clinical Impression Statement Patient presents to clinic with excellent motivation to participate in therapy. Patient demonstrates deficits in PFM coordination, PFM strength, scar mobility, intra-abdominal pressure management, pain, and posture. Patient articulating excellent reasoning and planning for return to gym-based exercise program during today's session and responded positively to educational interventions. Patient will benefit from continued skilled therapeutic intervention to address remaining deficits in PFM coordination, PFM strength, scar mobility, intra-abdominal pressure management, pain, and posture in order to increase function and improve overall QOL.    Personal Factors and  Comorbidities Comorbidity 3+;Behavior Pattern;Past/Current Experience;Time since onset of injury/illness/exacerbation    Comorbidities OSA, hx of endometriosis, hx of hysterectomy, chronic B LBP, chronic r knee pain    Examination-Activity Limitations Transfers;Sleep;Continence;Squat;Lift;Bend;Caring for Others    Examination-Participation Restrictions Interpersonal Relationship;Cleaning;Laundry;Yard Work;Community Activity    Stability/Clinical Decision Making Evolving/Moderate complexity    Rehab Potential Good    PT Frequency 1x / week    PT Duration 12 weeks    PT Treatment/Interventions Cryotherapy;Electrical Stimulation;Moist Heat;Therapeutic exercise;Neuromuscular re-education;Patient/family education;Manual techniques;Scar mobilization;Dry needling;Taping;Spinal Manipulations;Joint Manipulations    PT Next Visit Plan gym based program with cardio and strength training circuit    PT Stewart and Agree with Plan of Care Patient             Patient will benefit from skilled therapeutic  intervention in order to improve the following deficits and impairments:  Pain, Decreased coordination, Decreased strength, Increased fascial restricitons, Improper body mechanics, Decreased scar mobility, Increased muscle spasms  Visit Diagnosis: Pelvic pain  Other lack of coordination  Muscle weakness (generalized)     Problem List There are no problems to display for this patient.   Myles Gip PT, DPT [phone]  02/16/2021, 3:44 PM  [facility] [address] [place], [place], [address] Phone: [phone]   Fax:  [phone]  Name: Melanie Krause MRN: [id number] Date of Birth: 28-Sep-1970

## 2021-03-04 ENCOUNTER — Ambulatory Visit: Payer: Medicaid Other | Admitting: Physical Therapy

## 2021-03-04 ENCOUNTER — Encounter: Payer: Self-pay | Admitting: Physical Therapy

## 2021-03-04 ENCOUNTER — Other Ambulatory Visit: Payer: Self-pay

## 2021-03-04 DIAGNOSIS — R102 Pelvic and perineal pain: Secondary | ICD-10-CM | POA: Diagnosis not present

## 2021-03-04 DIAGNOSIS — R278 Other lack of coordination: Secondary | ICD-10-CM

## 2021-03-04 DIAGNOSIS — M6281 Muscle weakness (generalized): Secondary | ICD-10-CM

## 2021-03-04 NOTE — Therapy (Signed)
[facility] [address] [place], [place], [address] Phone: [phone]   Fax:  [phone]  Physical Therapy Treatment  Patient Details  Name: Melanie Krause MRN: [id number] Date of Birth: [birth year] Referring Provider (PT): Benjaman Kindler   Encounter Date: 03/04/2021   PT End of Session - 03/04/21 1131     Visit Number 16    Number of Visits 28    Date for PT Re-Evaluation 04/01/21    Authorization Type IE 08/13/2020; PN 10/29/2020    PT Start Time 0900    PT Stop Time 0940    PT Time Calculation (min) 40 min    Activity Tolerance Patient tolerated treatment well    Behavior During Therapy [facility] for tasks assessed/performed             History reviewed. No pertinent past medical history.  Past Surgical History:  Procedure Laterality Date   CESAREAN SECTION     HYSTEROSCOPY     LAPAROSCOPIC BILATERAL SALPINGECTOMY Bilateral 04/13/2015   Procedure: LAPAROSCOPIC BILATERAL SALPINGECTOMY;  Surgeon: Honor Loh Ward, MD;  Location: [facility] ORS;  Service: Gynecology;  Laterality: Bilateral;   LAPAROSCOPIC HYSTERECTOMY N/A 04/13/2015   Procedure: HYSTERECTOMY TOTAL LAPAROSCOPIC;  Surgeon: Honor Loh Ward, MD;  Location: [facility]S;  Service: Gynecology;  Laterality: N/A;   laparoscopy     TONSILLECTOMY     UMBILICAL HERNIA REPAIR  04/13/2015   Procedure: HERNIA REPAIR UMBILICAL ADULT;  Surgeon: Honor Loh Ward, MD;  Location: [facility] ORS;  Service: Gynecology;;    There were no vitals filed for this visit.   Subjective Assessment - 03/04/21 0907     Subjective Patient has been doing home workouts and gradually building in more. Patient did have some increased L hip pain but this has resolved and she is starting back with some previously prescribed exercises.    Currently in Pain? No/denies            TREATMENT  Therapeutic exercise: Planned and organized weekly home based strengthening and cardiovascular program with emphasis on  core stability, hip strength, posterior chain activation for improved postural endurance, PFM endurance, and overall wellness.  Access Code [email or web address] Access Code [email or web address] Access Code [facility] Access Code [email or web address]  Patient educated throughout session on appropriate technique and form using multi-modal cueing, HEP, and activity modification. Patient articulated understanding and returned demonstration.  Patient Response to interventions: Return in 1 week for d/c  ASSESSMENT Patient presents to clinic with excellent motivation to participate in therapy. Patient demonstrates deficits in PFM coordination, PFM strength, scar mobility, intra-abdominal pressure management, pain, and posture. Patient expressing comfort with more organized home-based workout program during today's session and responded positively to educational interventions. DPT and patient discussed potential discharge at next visit if application of new home based program is well-tolerated over the course of this week. Patient will benefit from continued skilled therapeutic intervention to address remaining deficits in PFM coordination, PFM strength, scar mobility, intra-abdominal pressure management, pain, and posture in order to increase function and improve overall QOL.   PT Long Term Goals - 03/04/21 1132       PT LONG TERM GOAL #1   Title Patient will demonstrate independence with HEP in order to maximize therapeutic gains and improve carryover from physical therapy sessions to ADLs in the home and community.    Baseline IE: not demonstrated; 10/7: IND    Time 12    Period Weeks    Status Achieved  PT LONG TERM GOAL #2   Title Patient will decrease worst pain as reported on NPRS by at least 2 points to demonstrate clinically significant reduction in pain in order to restore/improve function and overall QOL.    Baseline IE: 10+/10; 10/7: 1-2/10    Time 12    Period Weeks    Status Achieved      PT LONG TERM GOAL #3    Title Patient will demonstrate improved function as evidenced by a score of 69 on FOTO Urinary measure for full participation in activities at home and in the community.    Baseline IE: 60; 10/7: 69    Time 12    Period Weeks    Status Achieved      PT LONG TERM GOAL #4   Title Patient will demonstrate improved function as evidenced by a score of <26 on FOTO PFDI Pain measure for full participation in activities at home and in the community.    Baseline IE: 46; 10/7: 4    Time 12    Period Weeks    Status Achieved      PT LONG TERM GOAL #5   Title Patient will report urine leaking "never" with physical activity and before getting to the toilet to demonstrate complete resolution of complaint and allow for improved participation in the community.    Baseline 10/7: "once or less per week"; 2/24: "never"    Time 8    Period Weeks    Status Achieved    Target Date 02/18/21      PT LONG TERM GOAL #6   Title Patient will be able to participate in gym based exercise program including cardio and weight training for 40 minutes without need to void and UI in order to independently manage overall health and decrease risk of adverse health events.    Baseline 12/24/2020: has not attempted; 2/24: has started short bouts of regular exercise without issue (~10-15 min)    Time 4    Period Weeks    Status On-going    Target Date 04/01/21                   Plan - 03/04/21 1132     Clinical Impression Statement Patient presents to clinic with excellent motivation to participate in therapy. Patient demonstrates deficits in PFM coordination, PFM strength, scar mobility, intra-abdominal pressure management, pain, and posture. Patient expressing comfort with more organized home-based workout program during today's session and responded positively to educational interventions. DPT and patient discussed potential discharge at next visit if application of new home based program is well-tolerated over  the course of this week. Patient will benefit from continued skilled therapeutic intervention to address remaining deficits in PFM coordination, PFM strength, scar mobility, intra-abdominal pressure management, pain, and posture in order to increase function and improve overall QOL.    Personal Factors and Comorbidities Comorbidity 3+;Behavior Pattern;Past/Current Experience;Time since onset of injury/illness/exacerbation    Comorbidities OSA, hx of endometriosis, hx of hysterectomy, chronic B LBP, chronic r knee pain    Examination-Activity Limitations Transfers;Sleep;Continence;Squat;Lift;Bend;Caring for Others    Examination-Participation Restrictions Interpersonal Relationship;Cleaning;Laundry;Yard Work;Community Activity    Stability/Clinical Decision Making Evolving/Moderate complexity    Rehab Potential Good    PT Frequency 1x / week    PT Duration 12 weeks    PT Treatment/Interventions Cryotherapy;Electrical Stimulation;Moist Heat;Therapeutic exercise;Neuromuscular re-education;Patient/family education;Manual techniques;Scar mobilization;Dry needling;Taping;Spinal Manipulations;Joint Manipulations    PT Next Visit Plan d/c?    [place]  Consulted and Agree with Plan of Care Patient             Patient will benefit from skilled therapeutic intervention in order to improve the following deficits and impairments:  Pain, Decreased coordination, Decreased strength, Increased fascial restricitons, Improper body mechanics, Decreased scar mobility, Increased muscle spasms  Visit Diagnosis: Pelvic pain  Muscle weakness (generalized)  Other lack of coordination     Problem List There are no problems to display for this patient.   Myles Gip PT, DPT [phone]  03/04/2021, 11:41 AM   [facility] [address] [place], [place], [address] Phone: [phone]   Fax:  [phone]  Name: Melanie Krause MRN:  [id number] Date of Birth: [birth year]

## 2021-03-11 ENCOUNTER — Ambulatory Visit: Payer: Medicaid Other | Attending: Obstetrics and Gynecology | Admitting: Physical Therapy

## 2021-03-11 ENCOUNTER — Encounter: Payer: Self-pay | Admitting: Physical Therapy

## 2021-03-11 ENCOUNTER — Other Ambulatory Visit: Payer: Self-pay

## 2021-03-11 DIAGNOSIS — M6281 Muscle weakness (generalized): Secondary | ICD-10-CM | POA: Diagnosis present

## 2021-03-11 DIAGNOSIS — R278 Other lack of coordination: Secondary | ICD-10-CM | POA: Insufficient documentation

## 2021-03-11 DIAGNOSIS — R102 Pelvic and perineal pain: Secondary | ICD-10-CM | POA: Insufficient documentation

## 2021-03-11 NOTE — Therapy (Signed)
[place] ?[facility] ?[address] [place], [place], [address] ?Phone: [phone]   Fax:  [phone] ? ?Physical Therapy Treatment ? ?Patient Details  ?Name: Melanie Krause ?MRN: [id number] ?Date of Birth: [birth year] ?Referring Provider (PT): Benjaman Kindler ? ? ?Encounter Date: 03/11/2021 ? ? PT End of Session - 03/11/21 0939   ? ? Visit Number 17   ? Number of Visits 28   ? Date for PT Re-Evaluation 04/01/21   ? Authorization Type IE 08/13/2020; PN 10/29/2020   ? PT Start Time 1245   ? PT Stop Time 1015   ? PT Time Calculation (min) 40 min   ? Activity Tolerance Patient tolerated treatment well   ? Behavior During Therapy [facility] for tasks assessed/performed   ? ?  ?  ? ?  ? ? ?History reviewed. No pertinent past medical history. ? ?Past Surgical History:  ?Procedure Laterality Date  ? CESAREAN SECTION    ? HYSTEROSCOPY    ? LAPAROSCOPIC BILATERAL SALPINGECTOMY Bilateral 04/13/2015  ? Procedure: LAPAROSCOPIC BILATERAL SALPINGECTOMY;  Surgeon: Honor Loh Ward, MD;  Location: [facility] ORS;  Service: Gynecology;  Laterality: Bilateral;  ? LAPAROSCOPIC HYSTERECTOMY N/A 04/13/2015  ? Procedure: HYSTERECTOMY TOTAL LAPAROSCOPIC;  Surgeon: Honor Loh Ward, MD;  Location: [facility] ORS;  Service: Gynecology;  Laterality: N/A;  ? laparoscopy    ? TONSILLECTOMY    ? UMBILICAL HERNIA REPAIR  04/13/2015  ? Procedure: HERNIA REPAIR UMBILICAL ADULT;  Surgeon: Honor Loh Ward, MD;  Location: [facility] ORS;  Service: Gynecology;;  ? ? ?There were no vitals filed for this visit. ? ? Subjective Assessment - 03/11/21 0939   ? ? Subjective Patient has been doing HEP with good success while building up her endurance/reps. Patient is feeling encouraged.   ? ?  ?  ? ?  ? ?TREATMENT ? ?Therapeutic exercise: ?Reassessed goals and reviewed modifications to HEP.  ? ?Patient educated throughout session on appropriate technique and form using multi-modal cueing, HEP, and activity modification. Patient articulated understanding  and returned demonstration. ? ?Patient Response to interventions: ?Comfortable to trial discharge. ? ?ASSESSMENT ?Patient presents to clinic with excellent motivation to participate in therapy. Patient demonstrates minimal/ negligible remaining deficits in PFM coordination, PFM strength, scar mobility, intra-abdominal pressure management, pain, and posture. Patient indicating goal achievement in all areas and comfort with trial period of discharge during today's session and has responded positively to active, manual , and educational interventions throughout the course of care. Barring any regression or re-emergence of symptoms, patient is appropriate to self-manage PFM coordination, PFM strength, scar mobility, intra-abdominal pressure management, pain, and posture in order to continue/maintain increased function and improved overall QOL. ? ? PT Long Term Goals - 03/04/21 1132   ? ?  ? PT LONG TERM GOAL #1  ? Title Patient will demonstrate independence with HEP in order to maximize therapeutic gains and improve carryover from physical therapy sessions to ADLs in the home and community.   ? Baseline IE: not demonstrated; 10/7: IND   ? Time 12   ? Period Weeks   ? Status Achieved   ?  ? PT LONG TERM GOAL #2  ? Title Patient will decrease worst pain as reported on NPRS by at least 2 points to demonstrate clinically significant reduction in pain in order to restore/improve function and overall QOL.   ? Baseline IE: 10+/10; 10/7: 1-2/10   ? Time 12   ? Period Weeks   ? Status Achieved   ?  ?  PT LONG TERM GOAL #3  ? Title Patient will demonstrate improved function as evidenced by a score of 69 on FOTO Urinary measure for full participation in activities at home and in the community.   ? Baseline IE: 60; 10/7: 69   ? Time 12   ? Period Weeks   ? Status Achieved   ?  ? PT LONG TERM GOAL #4  ? Title Patient will demonstrate improved function as evidenced by a score of <26 on FOTO PFDI Pain measure for full participation in  activities at home and in the community.   ? Baseline IE: 52; 10/7: 4   ? Time 12   ? Period Weeks   ? Status Achieved   ?  ? PT LONG TERM GOAL #5  ? Title Patient will report urine leaking "never" with physical activity and before getting to the toilet to demonstrate complete resolution of complaint and allow for improved participation in the community.   ? Baseline 10/7: "once or less per week"; 2/24: "never"   ? Time 8   ? Period Weeks   ? Status Achieved   ? Target Date 02/18/21   ?  ? PT LONG TERM GOAL #6  ? Title Patient will be able to participate in gym based exercise program including cardio and weight training for 40 minutes without need to void and UI in order to independently manage overall health and decrease risk of adverse health events.   ? Baseline 12/24/2020: has not attempted; 2/24: has started short bouts of regular exercise without issue (~10-15 min)   ? Time 4   ? Period Weeks   ? Status On-going   ? Target Date 04/01/21   ? ?  ?  ? ?  ? ? ? ? ? ? ? ? Plan - 03/11/21 1127   ? ? Clinical Impression Statement Patient presents to clinic with excellent motivation to participate in therapy. Patient demonstrates minimal/ negligible remaining deficits in PFM coordination, PFM strength, scar mobility, intra-abdominal pressure management, pain, and posture. Patient indicating goal achievement in all areas and comfort with trial period of discharge during today's session and has responded positively to active, manual , and educational interventions throughout the course of care. Barring any regression or re-emergence of symptoms, patient is appropriate to self-manage PFM coordination, PFM strength, scar mobility, intra-abdominal pressure management, pain, and posture in order to continue/maintain increased function and improved overall QOL.   ? Personal Factors and Comorbidities Comorbidity 3+;Behavior Pattern;Past/Current Experience;Time since onset of injury/illness/exacerbation   ? Comorbidities OSA,  hx of endometriosis, hx of hysterectomy, chronic B LBP, chronic r knee pain   ? Examination-Activity Limitations Transfers;Sleep;Continence;Squat;Lift;Bend;Caring for Others   ? Examination-Participation Restrictions Interpersonal Relationship;Cleaning;Laundry;Yard Work;Community Activity   ? Stability/Clinical Decision Making Evolving/Moderate complexity   ? Rehab Potential Good   ? PT Frequency 1x / week   ? PT Duration 12 weeks   ? PT Treatment/Interventions Cryotherapy;Electrical Stimulation;Moist Heat;Therapeutic exercise;Neuromuscular re-education;Patient/family education;Manual techniques;Scar mobilization;Dry needling;Taping;Spinal Manipulations;Joint Manipulations   ? PT Home Exercise Plan [email or web address]   ? Consulted and Agree with Plan of Care Patient   ? ?  ?  ? ?  ? ? ?Patient will benefit from skilled therapeutic intervention in order to improve the following deficits and impairments:  Pain, Decreased coordination, Decreased strength, Increased fascial restricitons, Improper body mechanics, Decreased scar mobility, Increased muscle spasms ? ?Visit Diagnosis: ?Pelvic pain ? ?Muscle weakness (generalized) ? ?Other lack of coordination ? ? ? ? ?Problem List ?There are  no problems to display for this patient. ? ? ?Myles Gip PT, DPT [phone]  ?03/11/2021, 11:33 AM ? ?[place] ?[facility] ?[address] [place], [place], [address] ?Phone: [phone]   Fax:  [phone] ? ?Name: Duha Abair ?MRN: [id number] ?Date of Birth: 1970/08/08 ? ? ? ?

## 2021-03-18 ENCOUNTER — Encounter: Payer: Medicaid Other | Admitting: Physical Therapy

## 2021-03-25 ENCOUNTER — Encounter: Payer: Medicaid Other | Admitting: Physical Therapy

## 2021-04-08 ENCOUNTER — Encounter: Payer: Medicaid Other | Admitting: Physical Therapy

## 2021-07-04 IMAGING — US US ABDOMEN LIMITED
1 series · 14 of 25 positions shown · non-contrast
Comparison: No recent.

CLINICAL DATA: Elevated LFTs.

EXAM:
ULTRASOUND ABDOMEN LIMITED RIGHT UPPER QUADRANT

[Series 1: us abdomen limited · 0.25mm/px · 14 of 38 slices shown]
[im 1/38]
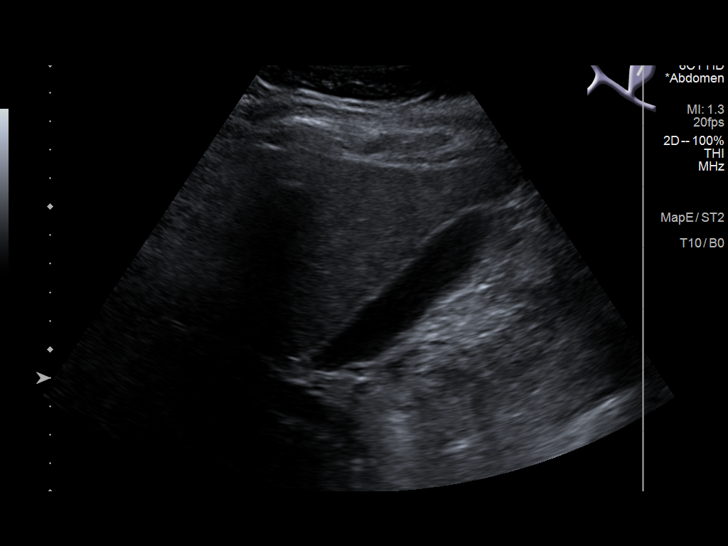
[im 4/38]
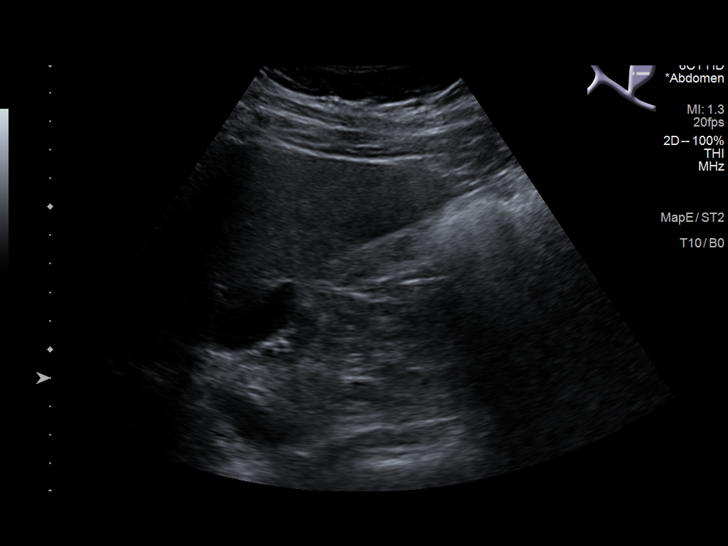
[im 7/38]
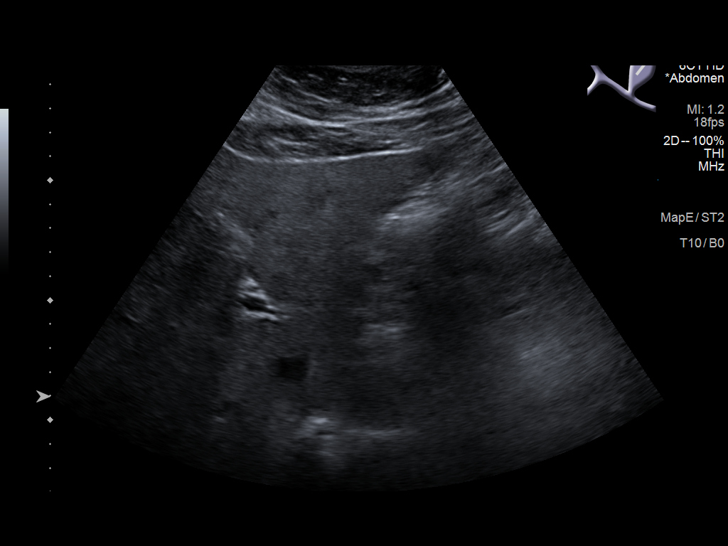
[im 10/38]
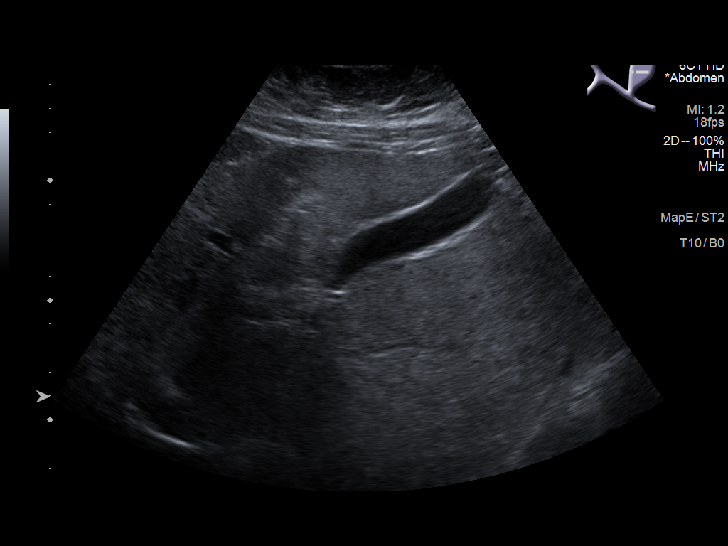
[im 13/38]
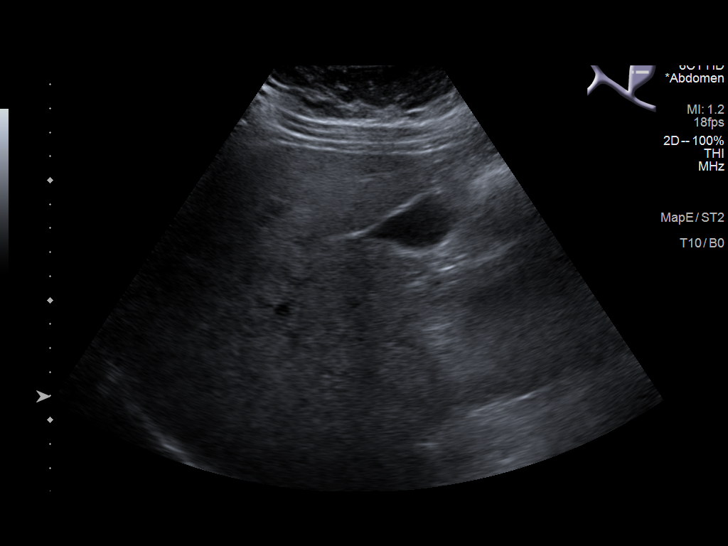
[im 14/38]
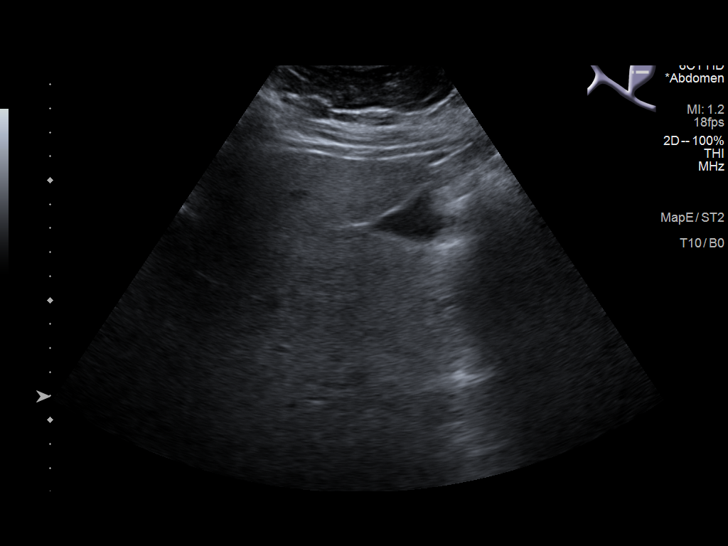
[im 17/38]
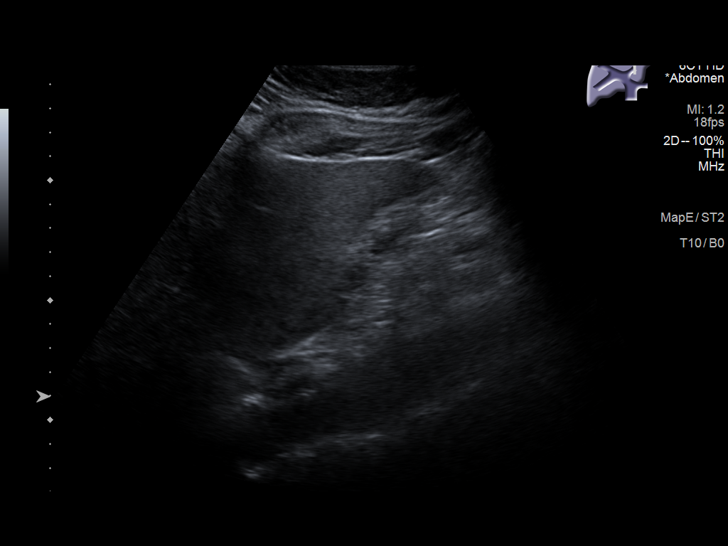
[im 21/38]
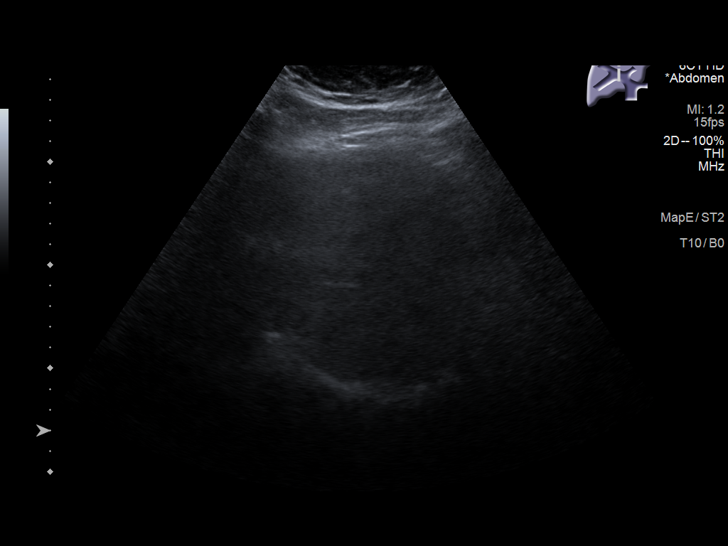
[im 24/38]
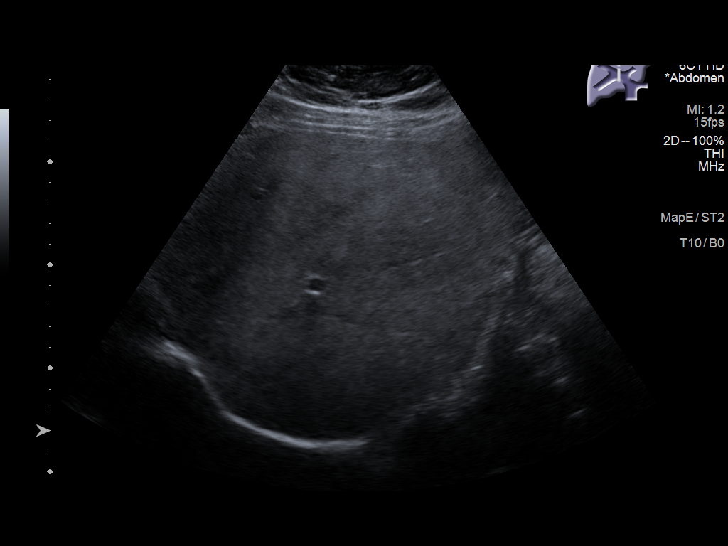
[im 25/38]
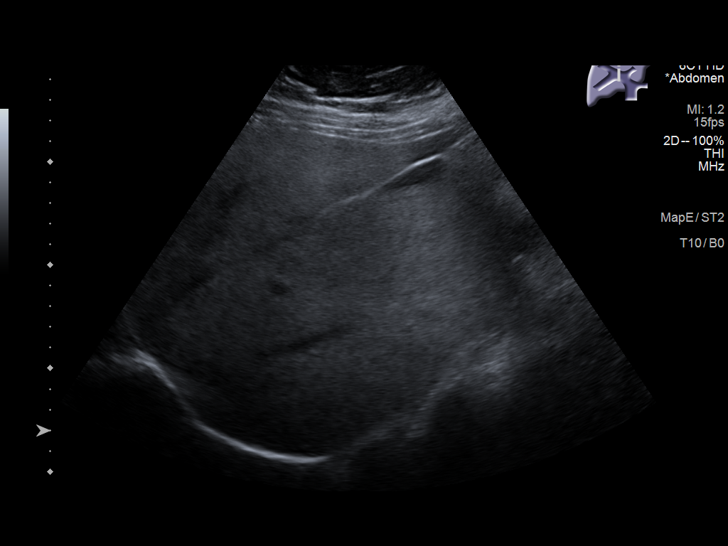
[im 28/38]
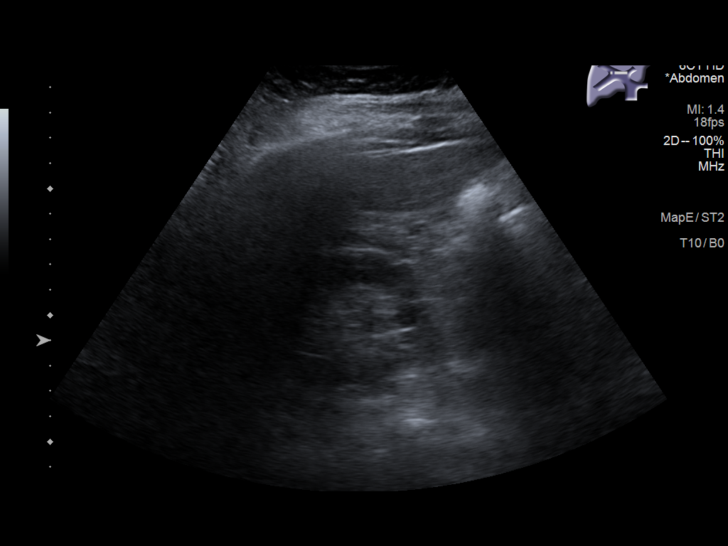
[im 31/38]
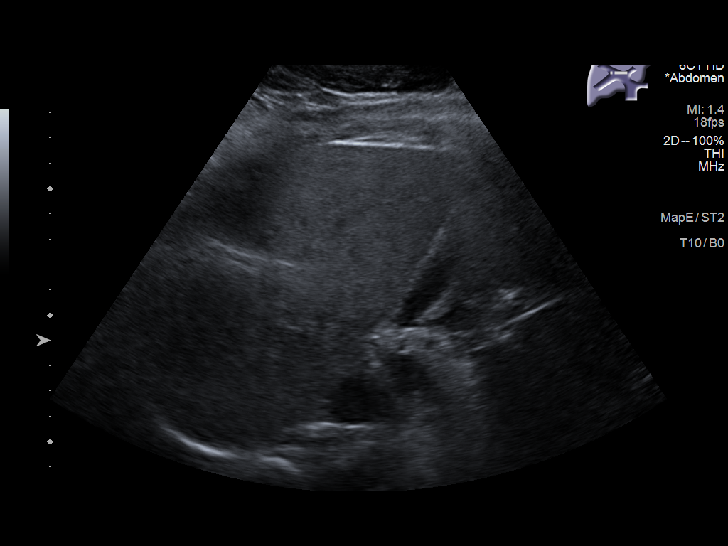
[im 34/38]
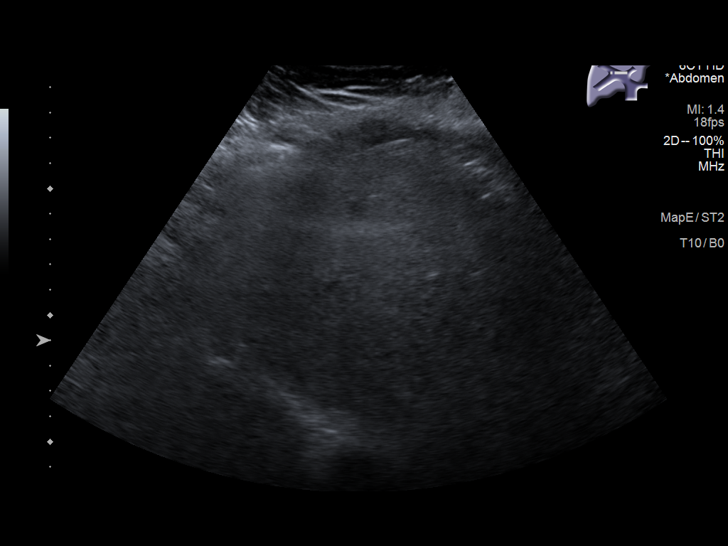
[im 38/38]
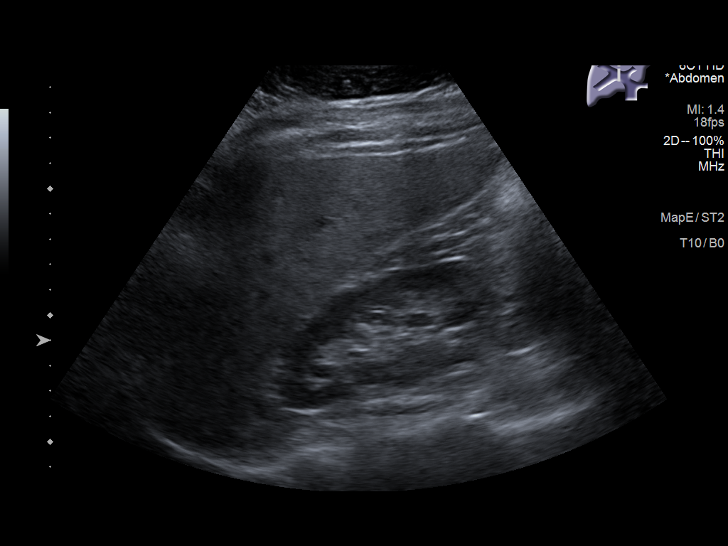

[14 of 25 positions shown; findings below may reference images not displayed]

FINDINGS: Gallbladder:

No gallstones or wall thickening visualized. No sonographic Murphy
sign noted by sonographer.

Common bile duct:

Diameter: 4.3 mm

Liver:

Increased echogenicity consistent fatty infiltration or
hepatocellular disease. No focal hepatic abnormality identified.
Portal vein is patent on color Doppler imaging with normal direction
of blood flow towards the liver.

Other: None.
IMPRESSION: 1.  No gallstones or biliary distention.

2. Increased echogenicity consistent fatty infiltration and/or
hepatocellular disease.

## 2022-01-27 ENCOUNTER — Emergency Department: Payer: Medicaid Other

## 2022-01-27 ENCOUNTER — Encounter: Payer: Self-pay | Admitting: Emergency Medicine

## 2022-01-27 ENCOUNTER — Emergency Department
Admission: EM | Admit: 2022-01-27 | Discharge: 2022-01-27 | Disposition: A | Discharge status: Home / Self Care | Payer: Medicaid Other | Attending: Student in an Organized Health Care Education/Training Program | Admitting: Student in an Organized Health Care Education/Training Program

## 2022-01-27 DIAGNOSIS — R0789 Other chest pain: Secondary | ICD-10-CM

## 2022-01-27 LAB — CBC
HCT: 43.5 % (ref 36.0–46.0)
Hemoglobin: 13.6 g/dL (ref 12.0–15.0)
MCH: 29.2 pg (ref 26.0–34.0)
MCHC: 31.3 g/dL (ref 30.0–36.0)
MCV: 93.3 fL (ref 80.0–100.0)
Platelets: 421 10*3/uL — ABNORMAL HIGH (ref 150–400)
RBC: 4.66 MIL/uL (ref 3.87–5.11)
RDW: 13.2 % (ref 11.5–15.5)
WBC: 13 10*3/uL — ABNORMAL HIGH (ref 4.0–10.5)
nRBC: 0 % (ref 0.0–0.2)

## 2022-01-27 LAB — BASIC METABOLIC PANEL
Anion gap: 6 (ref 5–15)
BUN: 18 mg/dL (ref 6–20)
CO2: 29 mmol/L (ref 22–32)
Calcium: 8.6 mg/dL — ABNORMAL LOW (ref 8.9–10.3)
Chloride: 103 mmol/L (ref 98–111)
Creatinine, Ser: 1.09 mg/dL — ABNORMAL HIGH (ref 0.44–1.00)
GFR, Estimated: >60 mL/min (ref >60)
Glucose, Bld: 90 mg/dL (ref 70–99)
Potassium: 3.9 mmol/L (ref 3.5–5.1)
Sodium: 138 mmol/L (ref 135–145)

## 2022-01-27 LAB — TROPONIN I (HIGH SENSITIVITY)
Troponin I (High Sensitivity): 13 ng/L (ref <18)
Troponin I (High Sensitivity): 13 ng/L (ref <18)

## 2022-01-27 LAB — POC URINE PREG, ED: Preg Test, Ur: NEGATIVE

## 2022-01-27 NOTE — ED Notes (Signed)
2nd Trop drawn.

## 2022-01-27 NOTE — ED Provider Notes (Signed)
[facility] Provider Note    Event Date/Time   First MD Initiated Contact with Patient 01/27/22 1201     (approximate)   History   Chest Pain   HPI  Melanie Krause is a 52 y.o. female with no reported past medical history presents today for evaluation of chest tightness.  Patient reports that this has been intermittent for the past couple of days.  She reports that it was worse yesterday, and is significantly improved today.  She reports that her pain is worsened with palpation of her chest.  She has not noticed any bruising or rashes.  She denies feeling short of breath.  She had tingling in her right arm yesterday which has resolved completely.  She denies any radiation of pain to her back.  She denies pain with deep breaths.  She denies exertional symptoms.  She denies any recent sick symptoms.  There are no problems to display for this patient.         Physical Exam   Triage Vital Signs: ED Triage Vitals  Enc Vitals Group     BP 01/27/22 1120 (!) 142/91     Pulse Rate 01/27/22 1120 68     Resp 01/27/22 1120 18     Temp 01/27/22 1120 98.2 F (36.8 C)     Temp src --      SpO2 01/27/22 1120 98 %     Weight 01/27/22 1119 250 lb (113.4 kg)     Height 01/27/22 1119 '5\' 6"'$  (1.676 m)     Head Circumference --      Peak Flow --      Pain Score 01/27/22 1119 3     Pain Loc --      Pain Edu? --      Excl. in [place]? --     Most recent vital signs: Vitals:   01/27/22 1120  BP: (!) 142/91  Pulse: 68  Resp: 18  Temp: 98.2 F (36.8 C)  SpO2: 98%    Physical Exam Vitals and nursing note reviewed.  Constitutional:      General: Awake and alert. No acute distress.    Appearance: Normal appearance. The patient is normal weight.  HENT:     Head: Normocephalic and atraumatic.     Mouth: Mucous membranes are moist.  Eyes:     General: PERRL. Normal EOMs        Right eye: No discharge.        Left eye: No discharge.     Conjunctiva/sclera:  Conjunctivae normal.  Cardiovascular:     Rate and Rhythm: Normal rate and regular rhythm.     Pulses: Normal pulses.  Equal in all 4 extremities    Heart sounds: Normal heart sounds Pulmonary:     Effort: Pulmonary effort is normal. No respiratory distress.     Breath sounds: Normal breath sounds.  Able to speak easily in complete sentences Mild tenderness to anterior chest wall without rash or injury noted Abdominal:     Abdomen is soft. There is no abdominal tenderness. No rebound or guarding. No distention. Musculoskeletal:        General: No swelling. Normal range of motion.     Cervical back: Normal range of motion and neck supple.  Skin:    General: Skin is warm and dry.     Capillary Refill: Capillary refill takes less than 2 seconds.     Findings: No rash.  Neurological:     Mental  Status: The patient is awake and alert.      ED Results / Procedures / Treatments   Labs (all labs ordered are listed, but only abnormal results are displayed) Labs Reviewed  BASIC METABOLIC PANEL - Abnormal; Notable for the following components:      Result Value   Creatinine, Ser 1.09 (*)    Calcium 8.6 (*)    All other components within normal limits  CBC - Abnormal; Notable for the following components:   WBC 13.0 (*)    Platelets 421 (*)    All other components within normal limits  POC URINE PREG, ED  TROPONIN I (HIGH SENSITIVITY)  TROPONIN I (HIGH SENSITIVITY)     EKG     RADIOLOGY I independently reviewed and interpreted imaging and agree with radiologists findings.     PROCEDURES:  Critical Care performed:   Procedures   MEDICATIONS ORDERED IN ED: Medications - No data to display   IMPRESSION / MDM / [place] / ED COURSE  I reviewed the triage vital signs and the nursing notes.   Differential diagnosis includes, but is not limited to, costochondritis, pleurisy, acute coronary syndrome, pulmonary embolism, atypical chest pain.  Patient is  awake and alert, hemodynamically stable and afebrile.  EKG obtained in triage demonstrates no acute ischemic signs.  She was placed on the cardiac monitor.  No tachycardia or hypoxia, no clinical signs or symptoms of DVT, no pleurisy, no shortness of breath, no recent travel, periods of immobilization, surgeries, no exogenous hormone use, no personal or family history of PE or DVT, do not suspect pulmonary embolism.  I have personally reviewed and interpreted the EKG. HEART Score 0 for MACE in 6 weeks.  Troponin x 2 within normal limits, no exertional symptoms.  Overall well-appearing. No ST/PR changes to suggest pericarditis. No pneumothorax, normal mediastinal width, no radiation to back.  Her pain is easily reproducible with palpation, suspect MSK etiology as likely cause.  No constitutional or infectious type symptoms to suggest infectious etiology.  No fever to suggest myocarditis.  No recent illnesses.  Discussed care plan, return precautions, and advised close outpatient follow-up. Patient agrees with plan of care.    Patient's presentation is most consistent with acute complicated illness / injury requiring diagnostic workup.     FINAL CLINICAL IMPRESSION(S) / ED DIAGNOSES   Final diagnoses:  Atypical chest pain     Rx / DC Orders   ED Discharge Orders     None        Note:  This document was prepared using Dragon voice recognition software and may include unintentional dictation errors.   Emeline Gins 01/27/22 1443    Merlyn Lot, MD 01/27/22 [phone]

## 2022-01-27 NOTE — Discharge Instructions (Addendum)
Your EKG, blood work, and chest x-ray are normal.  Please return for any new, worsening, or change in symptoms or other concerns.  It was a pleasure caring for you today.

## 2022-01-27 NOTE — ED Triage Notes (Signed)
Patient to ED via POV for chest pain- left side since yesterday. Seen at [facility] yesterday but [facility]. Denies cardiac hx. Pain 3/10

## 2022-01-27 NOTE — ED Triage Notes (Signed)
First nurse note: Pt to ED via [facility]. Pt was seen at [facility] yesterday and waited 13hrs. Pt c/o Chest pressure and arm pain that is still present. Pt sent over for cardiac work up
